# Patient Record
Sex: Male | Born: 1960 | Race: Black or African American | Hispanic: No | Marital: Single | Smoking: Former smoker
Health system: Southern US, Community
[De-identification: ages and names within clinical notes are randomized; demographics above are authoritative.]

## PROBLEM LIST (undated history)

## (undated) DIAGNOSIS — E1165 Type 2 diabetes mellitus with hyperglycemia: Secondary | ICD-10-CM

## (undated) DIAGNOSIS — IMO0002 Reserved for concepts with insufficient information to code with codable children: Secondary | ICD-10-CM

## (undated) DIAGNOSIS — I10 Essential (primary) hypertension: Secondary | ICD-10-CM

## (undated) DIAGNOSIS — I2510 Atherosclerotic heart disease of native coronary artery without angina pectoris: Secondary | ICD-10-CM

## (undated) DIAGNOSIS — E785 Hyperlipidemia, unspecified: Secondary | ICD-10-CM

## (undated) DIAGNOSIS — K219 Gastro-esophageal reflux disease without esophagitis: Secondary | ICD-10-CM

## (undated) DIAGNOSIS — I2699 Other pulmonary embolism without acute cor pulmonale: Secondary | ICD-10-CM

## (undated) HISTORY — PX: INGUINAL HERNIA REPAIR: SUR1180

---

## 2011-06-19 HISTORY — PX: CARDIAC CATHETERIZATION: SHX172

## 2012-06-18 DIAGNOSIS — I2699 Other pulmonary embolism without acute cor pulmonale: Secondary | ICD-10-CM

## 2012-06-18 HISTORY — DX: Other pulmonary embolism without acute cor pulmonale: I26.99

## 2015-11-08 ENCOUNTER — Emergency Department (HOSPITAL_COMMUNITY)

## 2015-11-08 ENCOUNTER — Inpatient Hospital Stay (HOSPITAL_COMMUNITY)
Admission: EM | Admit: 2015-11-08 | Discharge: 2015-11-13 | DRG: 305 | Disposition: A | Discharge status: Home / Self Care | Attending: Internal Medicine | Admitting: Internal Medicine

## 2015-11-08 ENCOUNTER — Encounter (HOSPITAL_COMMUNITY): Payer: Self-pay

## 2015-11-08 DIAGNOSIS — I200 Unstable angina: Secondary | ICD-10-CM

## 2015-11-08 DIAGNOSIS — R51 Headache: Secondary | ICD-10-CM

## 2015-11-08 DIAGNOSIS — R42 Dizziness and giddiness: Secondary | ICD-10-CM | POA: Diagnosis not present

## 2015-11-08 DIAGNOSIS — R079 Chest pain, unspecified: Secondary | ICD-10-CM

## 2015-11-08 DIAGNOSIS — Z23 Encounter for immunization: Secondary | ICD-10-CM

## 2015-11-08 DIAGNOSIS — R519 Headache, unspecified: Secondary | ICD-10-CM | POA: Diagnosis present

## 2015-11-08 DIAGNOSIS — I252 Old myocardial infarction: Secondary | ICD-10-CM | POA: Diagnosis not present

## 2015-11-08 DIAGNOSIS — I160 Hypertensive urgency: Secondary | ICD-10-CM | POA: Diagnosis not present

## 2015-11-08 DIAGNOSIS — Z86711 Personal history of pulmonary embolism: Secondary | ICD-10-CM | POA: Diagnosis present

## 2015-11-08 DIAGNOSIS — I2510 Atherosclerotic heart disease of native coronary artery without angina pectoris: Secondary | ICD-10-CM | POA: Diagnosis present

## 2015-11-08 DIAGNOSIS — Z7982 Long term (current) use of aspirin: Secondary | ICD-10-CM

## 2015-11-08 DIAGNOSIS — E1165 Type 2 diabetes mellitus with hyperglycemia: Secondary | ICD-10-CM | POA: Diagnosis present

## 2015-11-08 DIAGNOSIS — IMO0002 Reserved for concepts with insufficient information to code with codable children: Secondary | ICD-10-CM | POA: Diagnosis present

## 2015-11-08 DIAGNOSIS — Z87891 Personal history of nicotine dependence: Secondary | ICD-10-CM

## 2015-11-08 DIAGNOSIS — I10 Essential (primary) hypertension: Secondary | ICD-10-CM

## 2015-11-08 DIAGNOSIS — I119 Hypertensive heart disease without heart failure: Secondary | ICD-10-CM | POA: Diagnosis present

## 2015-11-08 DIAGNOSIS — Z794 Long term (current) use of insulin: Secondary | ICD-10-CM

## 2015-11-08 DIAGNOSIS — R9431 Abnormal electrocardiogram [ECG] [EKG]: Secondary | ICD-10-CM | POA: Diagnosis present

## 2015-11-08 DIAGNOSIS — Z8249 Family history of ischemic heart disease and other diseases of the circulatory system: Secondary | ICD-10-CM

## 2015-11-08 HISTORY — DX: Type 2 diabetes mellitus with hyperglycemia: E11.65

## 2015-11-08 HISTORY — DX: Other pulmonary embolism without acute cor pulmonale: I26.99

## 2015-11-08 HISTORY — DX: Gastro-esophageal reflux disease without esophagitis: K21.9

## 2015-11-08 HISTORY — DX: Reserved for concepts with insufficient information to code with codable children: IMO0002

## 2015-11-08 LAB — BASIC METABOLIC PANEL
Anion gap: 7 (ref 5–15)
BUN: 6 mg/dL (ref 6–20)
CALCIUM: 9.4 mg/dL (ref 8.9–10.3)
CO2: 27 mmol/L (ref 22–32)
Chloride: 103 mmol/L (ref 101–111)
Creatinine, Ser: 0.97 mg/dL (ref 0.61–1.24)
GFR calc Af Amer: >60 mL/min (ref >60)
GLUCOSE: 129 mg/dL — AB (ref 65–99)
POTASSIUM: 3.6 mmol/L (ref 3.5–5.1)
Sodium: 137 mmol/L (ref 135–145)

## 2015-11-08 LAB — LIPID PANEL
Cholesterol: 182 mg/dL (ref 0–200)
HDL: 41 mg/dL (ref >40)
LDL CALC: 122 mg/dL — AB (ref 0–99)
Total CHOL/HDL Ratio: 4.4 RATIO
Triglycerides: 96 mg/dL (ref <150)
VLDL: 19 mg/dL (ref 0–40)

## 2015-11-08 LAB — URINE MICROSCOPIC-ADD ON

## 2015-11-08 LAB — RAPID URINE DRUG SCREEN, HOSP PERFORMED
AMPHETAMINES: NOT DETECTED
BENZODIAZEPINES: NOT DETECTED
Barbiturates: NOT DETECTED
Cocaine: NOT DETECTED
OPIATES: NOT DETECTED
TETRAHYDROCANNABINOL: NOT DETECTED

## 2015-11-08 LAB — HEPATIC FUNCTION PANEL
ALBUMIN: 3.5 g/dL (ref 3.5–5.0)
ALK PHOS: 66 U/L (ref 38–126)
ALT: 17 U/L (ref 17–63)
AST: 16 U/L (ref 15–41)
BILIRUBIN TOTAL: 0.2 mg/dL — AB (ref 0.3–1.2)
Bilirubin, Direct: <0.1 mg/dL — ABNORMAL LOW (ref 0.1–0.5)
TOTAL PROTEIN: 6.5 g/dL (ref 6.5–8.1)

## 2015-11-08 LAB — CBC
HEMATOCRIT: 39.8 % (ref 39.0–52.0)
Hemoglobin: 13.3 g/dL (ref 13.0–17.0)
MCH: 27.6 pg (ref 26.0–34.0)
MCHC: 33.4 g/dL (ref 30.0–36.0)
MCV: 82.6 fL (ref 78.0–100.0)
Platelets: 163 10*3/uL (ref 150–400)
RBC: 4.82 MIL/uL (ref 4.22–5.81)
RDW: 14 % (ref 11.5–15.5)
WBC: 5.6 10*3/uL (ref 4.0–10.5)

## 2015-11-08 LAB — URINALYSIS, ROUTINE W REFLEX MICROSCOPIC
BILIRUBIN URINE: NEGATIVE
HGB URINE DIPSTICK: NEGATIVE
KETONES UR: 15 mg/dL — AB
LEUKOCYTES UA: NEGATIVE
Nitrite: NEGATIVE
PH: 5.5 (ref 5.0–8.0)
Protein, ur: 30 mg/dL — AB
Specific Gravity, Urine: 1.035 — ABNORMAL HIGH (ref 1.005–1.030)

## 2015-11-08 LAB — I-STAT TROPONIN, ED: Troponin i, poc: 0 ng/mL (ref 0.00–0.08)

## 2015-11-08 LAB — TROPONIN I

## 2015-11-08 MED ORDER — ASPIRIN 81 MG PO CHEW
324.0000 mg | CHEWABLE_TABLET | Freq: Once | ORAL | Status: AC
  Administered 2015-11-08: 324 mg via ORAL
  Filled 2015-11-08: qty 4

## 2015-11-08 MED ORDER — METOPROLOL TARTRATE 25 MG PO TABS
100.0000 mg | ORAL_TABLET | Freq: Two times a day (BID) | ORAL | Status: DC
  Administered 2015-11-08: 100 mg via ORAL
  Filled 2015-11-08: qty 4

## 2015-11-08 MED ORDER — LABETALOL HCL 5 MG/ML IV SOLN
10.0000 mg | INTRAVENOUS | Status: DC | PRN
  Administered 2015-11-08 – 2015-11-09 (×3): 10 mg via INTRAVENOUS
  Filled 2015-11-08 (×3): qty 4

## 2015-11-08 MED ORDER — NITROGLYCERIN IN D5W 200-5 MCG/ML-% IV SOLN
0.0000 ug/min | Freq: Once | INTRAVENOUS | Status: AC
  Administered 2015-11-08: 20 ug/min via INTRAVENOUS
  Filled 2015-11-08: qty 250

## 2015-11-08 MED ORDER — NITROGLYCERIN 0.4 MG SL SUBL
0.4000 mg | SUBLINGUAL_TABLET | SUBLINGUAL | Status: DC | PRN
  Administered 2015-11-09 – 2015-11-10 (×4): 0.4 mg via SUBLINGUAL
  Filled 2015-11-08: qty 1

## 2015-11-08 MED ORDER — CLONIDINE HCL 0.1 MG PO TABS
0.1000 mg | ORAL_TABLET | Freq: Four times a day (QID) | ORAL | Status: DC
  Administered 2015-11-08: 0.1 mg via ORAL
  Filled 2015-11-08: qty 1

## 2015-11-08 MED ORDER — HYDRALAZINE HCL 25 MG PO TABS
50.0000 mg | ORAL_TABLET | Freq: Three times a day (TID) | ORAL | Status: DC
  Administered 2015-11-08: 50 mg via ORAL
  Filled 2015-11-08: qty 2

## 2015-11-08 MED ORDER — ACETAMINOPHEN 325 MG PO TABS
650.0000 mg | ORAL_TABLET | Freq: Once | ORAL | Status: AC
  Administered 2015-11-08: 650 mg via ORAL
  Filled 2015-11-08: qty 2

## 2015-11-08 MED ORDER — OXYCODONE-ACETAMINOPHEN 5-325 MG PO TABS
1.0000 | ORAL_TABLET | Freq: Once | ORAL | Status: AC
Start: 2015-11-08 — End: 2015-11-08
  Administered 2015-11-08: 1 via ORAL
  Filled 2015-11-08: qty 1

## 2015-11-08 NOTE — ED Notes (Signed)
No changes, alert, NAD, calm, interactive, resps e/u, eating sandwich/ evening meal, titrating ntg gtt, [facility]

## 2015-11-08 NOTE — ED Notes (Signed)
Pt alert, NAD, calm, interactive, c/o CP, HA, sob, dizziness, nausea and some hunger, no discomfort noted, no dyspnea noted, BP elevated.

## 2015-11-08 NOTE — ED Notes (Signed)
Pt given meal as per Dr's request

## 2015-11-08 NOTE — H&P (Signed)
Date: 11/08/2015               Patient Name:  Raymond Crane MRN: [id number]  DOB: 01-01-1961 Age / Sex: 55 y.o., male   PCP: No Pcp Per Patient         Medical Service: Internal Medicine Teaching Service         Attending Physician: Dr. Inez CatalinaEmily B Mullen, MD    First Contact: Dr. Valentino NoseNathan Boswell Pager: [phone]  Second Contact: Dr. Hyacinth Meekerasrif Ahmed Pager: [phone]       After Hours (After 5p/  First Contact Pager: [phone]  weekends / holidays): Second Contact Pager: [phone]   Chief Complaint: High blood pressure, chest pain, headache  History of Present Illness: Raymond Crane is a 55 year old male with PMH of HTN, T2DM, MI, and PE who presents with hypertension, chest pain, and headache. [facility] is [facility]. Patient states that his pain began 2-3 days ago. He describes a pounding left sided chest pain with radiation down his left arm and up his neck to the back of his head. His pain has been constant and associated with subjective fever, diaphoresis, lightheadedness, blurry vision, occipital headache, and generalized weakness. He reports numbness and tingling in his left arm. He denies any loss of consciousness, confusion, nausea, vomiting, focal weakness, or back pain. He says this pain occurs when his blood pressures get high. He believes his normal systolic blood pressures range in the 140s - 150s. He takes Amlodipine, Chlorthalidone, Clonidine, Hydralazine, Lisinopril, and Metoprolol. He says he has not missed any doses as they administer and count pills at his institution.  Per nursing note, EMS were called and administered ASA 324 mg and nitro x 5 without relief. His BP was 230/130. He was started on a nitroglycerin infusion in the ED. CXR was without active cardiopulmonary disease. A Head CT was obtained and was negative for acute hemorrhage or infarct. A point of care troponin was negative. Patient reports some improvement of symptoms since nitro drip was started.      Social Hx: Previous smoker 15-20 pack years, former occasional alcohol use, used marijuana in the past, denies cocaine or IVDU  Family Hx: Heart disease in Mother, father, brother. Stroke in mother. DM and HTN in brother and sister.   Meds: Current Facility-Administered Medications  Medication Dose Route Frequency Provider Last Rate Last Dose  . cloNIDine (CATAPRES) tablet 0.1 mg  0.1 mg Oral Q6H Carly J Rivet, MD   0.1 mg at 11/08/15 2309  . hydrALAZINE (APRESOLINE) tablet 50 mg  50 mg Oral Q8H Carly J Rivet, MD      . labetalol (NORMODYNE,TRANDATE) injection 10 mg  10 mg Intravenous Q10 min PRN Su Hoffarly J Rivet, MD   10 mg at 11/08/15 2250  . metoprolol tartrate (LOPRESSOR) tablet 100 mg  100 mg Oral Q12H Su Hoffarly J Rivet, MD   100 mg at 11/08/15 2309  . nitroGLYCERIN (NITROSTAT) SL tablet 0.4 mg  0.4 mg Sublingual Q5 min PRN Su Hoffarly J Rivet, MD       Current Outpatient Prescriptions  Medication Sig Dispense Refill  . amLODipine (NORVASC) 10 MG tablet Take 10 mg by mouth every morning.    Marland Kitchen. aspirin 81 MG chewable tablet Chew 81 mg by mouth every morning.    . chlorthalidone (HYGROTON) 25 MG tablet Take 25 mg by mouth every morning.    . cloNIDine (CATAPRES) 0.1 MG tablet Take 0.1 mg by mouth every 6 (six)  hours. Hold if systolic blood pressure <160 and dystolic blood pressure <90    . gabapentin (NEURONTIN) 300 MG capsule Take 300 mg by mouth 3 (three) times daily.    . hydrALAZINE (APRESOLINE) 50 MG tablet Take 50 mg by mouth every 8 (eight) hours.     . insulin glargine (LANTUS) 100 UNIT/ML injection Inject 35 Units into the skin at bedtime.    . insulin regular (HUMULIN R) 100 units/mL injection Inject 2-12 Units into the skin 3 (three) times daily before meals. Per sliding scale: 200-249 = 2 units; 250-299 = 4 units; 300-349 = 6 units; 350-399 = 8 units; 400-499 = 10 units; 500-549 = 12 units; call provider if >550    . lisinopril (PRINIVIL,ZESTRIL) 40 MG tablet Take 40 mg by mouth every  morning.   0  . metFORMIN (GLUCOPHAGE) 1000 MG tablet Take 1,000 mg by mouth 2 (two) times daily with a meal.    . metoprolol (LOPRESSOR) 100 MG tablet Take 100 mg by mouth every 12 (twelve) hours.    . pantoprazole (PROTONIX) 20 MG tablet Take 20 mg by mouth every morning.    . pravastatin (PRAVACHOL) 40 MG tablet Take 40 mg by mouth at bedtime.    . ranolazine (RANEXA) 500 MG 12 hr tablet Take 1,000 mg by mouth every 12 (twelve) hours.      Allergies: Allergies as of 11/08/2015  . (No Known Allergies)   Past Medical History  Diagnosis Date  . Myocardial infarction (HCC) 2013  . Diabetes mellitus without complication (HCC)   . Hypertension   . Pulmonary embolism (HCC) 2014   Past Surgical History  Procedure Laterality Date  . Cardiac catheterization  2013   Family History  Problem Relation Age of Onset  . Heart disease Mother   . Heart disease Mother   . Stroke Mother   . Diabetes Sister   . Diabetes Brother   . Hypertension Sister   . Hypertension Brother    Social History   Social History  . Marital Status: Single    Spouse Name: N/A  . Number of Children: N/A  . Years of Education: N/A   Occupational History  . Not on file.   Social History Main Topics  . Smoking status: Former Games developer  . Smokeless tobacco: Not on file  . Alcohol Use: No  . Drug Use: No  . Sexual Activity: Not on file   Other Topics Concern  . Not on file   Social History Narrative    Review of Systems: Review of Systems  Constitutional: Positive for fever, malaise/fatigue and diaphoresis. Negative for chills.  Eyes: Positive for blurred vision.  Respiratory: Positive for shortness of breath. Negative for cough, hemoptysis, sputum production and wheezing.   Cardiovascular: Positive for chest pain and palpitations. Negative for orthopnea and leg swelling.  Gastrointestinal: Positive for nausea and diarrhea. Negative for heartburn, vomiting, abdominal pain, constipation, blood in stool  and melena.  Genitourinary: Positive for frequency. Negative for dysuria, urgency, hematuria and flank pain.  Musculoskeletal: Positive for neck pain. Negative for myalgias, back pain, joint pain and falls.  Neurological: Positive for dizziness, tingling, weakness and headaches. Negative for tremors, speech change, focal weakness and loss of consciousness.  Psychiatric/Behavioral: Negative for substance abuse.     Physical Exam: Blood pressure 137/79, pulse 72, temperature 97.8 F (36.6 C), resp. rate 12, height  (1.854 m), weight 255 lb (115.667 kg), SpO2 96 %. Physical Exam  Constitutional: He is oriented to  person, place, and time. He appears well-developed and well-nourished. No distress.  HENT:  Head: Normocephalic and atraumatic.  Mouth/Throat: Oropharynx is clear and moist.  Eyes: EOM are normal. Pupils are equal, round, and reactive to light.  Neck: Normal range of motion. Neck supple. Carotid bruit is not present.  Cardiovascular: Normal rate, regular rhythm and intact distal pulses.   Soft early systolic murmur RUS border  Pulmonary/Chest: Effort normal and breath sounds normal. No respiratory distress. He has no wheezes. He has no rales. He exhibits no tenderness.  Abdominal: Soft. Bowel sounds are normal. He exhibits no distension. There is no tenderness. There is no guarding.  Musculoskeletal: Normal range of motion. He exhibits no edema or tenderness.  Neurological: He is alert and oriented to person, place, and time. He has normal strength. He displays no tremor. Coordination normal.  Unequal sensation to touch in the V1 distribution. Strength 5/5 all extremities, coordination intact, finger grip and rapid finger tapping intact, finger-to-nose intact.   Skin: Skin is warm. He is not diaphoretic.  Psychiatric: He has a normal mood and affect.     Lab results: Basic Metabolic Panel:  Recent Labs  14/78/29 1754  NA 137  K 3.6  CL 103  CO2 27  GLUCOSE 129*    BUN 6  CREATININE 0.97  CALCIUM 9.4   Liver Function Tests: No results for input(s): AST, ALT, ALKPHOS, BILITOT, PROT, ALBUMIN in the last 72 hours. No results for input(s): LIPASE, AMYLASE in the last 72 hours. No results for input(s): AMMONIA in the last 72 hours. CBC:  Recent Labs  11/08/15 1754  WBC 5.6  HGB 13.3  HCT 39.8  MCV 82.6  PLT 163   Cardiac Enzymes: No results for input(s): CKTOTAL, CKMB, CKMBINDEX, TROPONINI in the last 72 hours. BNP: No results for input(s): PROBNP in the last 72 hours. D-Dimer: No results for input(s): DDIMER in the last 72 hours. CBG: No results for input(s): GLUCAP in the last 72 hours. Hemoglobin A1C: No results for input(s): HGBA1C in the last 72 hours. Fasting Lipid Panel: No results for input(s): CHOL, HDL, LDLCALC, TRIG, CHOLHDL, LDLDIRECT in the last 72 hours. Thyroid Function Tests: No results for input(s): TSH, T4TOTAL, FREET4, T3FREE, THYROIDAB in the last 72 hours. Anemia Panel: No results for input(s): VITAMINB12, FOLATE, FERRITIN, TIBC, IRON, RETICCTPCT in the last 72 hours. Coagulation: No results for input(s): LABPROT, INR in the last 72 hours. Urine Drug Screen: Drugs of Abuse  No results found for: LABOPIA, COCAINSCRNUR, LABBENZ, AMPHETMU, THCU, LABBARB  Alcohol Level: No results for input(s): ETH in the last 72 hours. Urinalysis: No results for input(s): COLORURINE, LABSPEC, PHURINE, GLUCOSEU, HGBUR, BILIRUBINUR, KETONESUR, PROTEINUR, UROBILINOGEN, NITRITE, LEUKOCYTESUR in the last 72 hours.  Invalid input(s): APPERANCEUR   Imaging results:  Dg Chest 2 View  11/08/2015  CLINICAL DATA:  Chest pain EXAM: CHEST  2 VIEW COMPARISON:  None. FINDINGS: Normal heart size. Normal mediastinal contour. No pneumothorax. No pleural effusion. Lungs appear clear, with no acute consolidative airspace disease and no pulmonary edema. IMPRESSION: No active cardiopulmonary disease. Electronically Signed   By: Delbert Phenix M.D.   On:  11/08/2015 18:57   Ct Head Wo Contrast  11/08/2015  CLINICAL DATA:  Headache, hypertension, dizziness. Symptom onset this afternoon. EXAM: CT HEAD WITHOUT CONTRAST TECHNIQUE: Contiguous axial images were obtained from the base of the skull through the vertex without intravenous contrast. COMPARISON:  None. FINDINGS: Brain: No evidence of acute infarction, hemorrhage, extra-axial collection, ventriculomegaly, or  mass effect. Vascular: No hyperdense vessel or unexpected calcification. Skull: Negative for fracture or focal lesion. Sinuses/Orbits: No acute findings. Other: Suspect small subgaleal hematoma about the left posterior parietal scalp, age-indeterminate. IMPRESSION: No acute intracranial abnormality. Electronically Signed   By: Rubye Oaks M.D.   On: 11/08/2015 21:17    Other results: EKG: sinus rhythm, TWI lead II, nonspecific changes II, aVL  Assessment & Plan by Problem: Principal Problem:   Hypertensive urgency Active Problems:   Type 2 diabetes mellitus (HCC)   History of pulmonary embolism   History of MI (myocardial infarction)   Chest pain   Hypertensive Urgency: Patient hypertensive to 230/130 with symptoms of left sided chest pain, posterior neck pain, occipital headache, and numbness/tingling down left arm. CT head was negative for hemorrhage. Patient reports similar symptoms in the past which indicate that his blood pressures are too high. He reports adherence to his prescribed blood pressure medications. He is currently on 6 antihypertensives. Unknown whether he has been worked up for secondary hypertension. We will continue home medications and give prn Labetalol for goal SBP of 160 while monitoring for signs of end-organ damage. Istat troponin is negative and CBC/BMP are within normal limits. Will check renal U/S to evaluate for renal artery stenosis. His chest pain is likely related to his HTN, but he reports a history of MI and probable CAD, so we will trend troponins  for ACS rule out. EKG shows t-wave inversions in lead III w/ non-specific changes in II and aVL. -Labetalol 10 mg IV q55min prn high blood pressures, goal SBP 160 -Amlodipine 10 mg qd -Clonidine 0.1 mg q6h (hold if SBP <160, DBP <90) -Chlorthalidone 25 mg qd -Hydralazine 50 mg q8h -Lisinopril 40 mg qd -Metoprolol 100 mg q12h -f/u Renal U/S -trend troponins -EKG -Check TSH, HIV, Lipid panel, UDS -prn nitro -Zofran prn   T2DM: Prescribed Metformin 1000 mg, Lantus 35u qhs, and Humulin sliding scale. Serum glucose 129. -Check Hgb A1C -SSI   Hx of MI: Reports MI 12-14 years ago. Had a cath, but unsure about stent. No records on file.  -Ranexa 1000 mg q12h -Pravastatin 20 mg qd -ASA 81 mg    Diet: Heart  DVT ppx: Lovenox  Code: FULL   Dispo: Disposition is deferred at this time, awaiting improvement of current medical problems. Anticipated discharge in approximately 1-2 day(s).   The patient does not have a current PCP (No Pcp Per Patient) and does not need an [facility] hospital follow-up appointment after discharge.  The patient does have transportation limitations that hinder transportation to clinic appointments.  Signed: Darreld Mclean, MD 11/08/2015, 11:18 PM

## 2015-11-08 NOTE — ED Provider Notes (Signed)
CSN: [id number]     Arrival date & time 11/08/15  1703 History   First MD Initiated Contact with Patient 11/08/15 1715     Chief Complaint  Patient presents with  . Chest Pain  . Hypertension   HPI  [name] is a 55 year old male with past medical history of hypertension, diabetes, MI and PE presenting with hypertension, headache and chest pain. Patient states that he can tell when his blood pressure is high because he gets a severe headache. He states that 4 days ago he had acute onset of occipital headache. He states it feels like his head is "about to bust". The headache has been gradually worsening over the past 4 days. He endorses associated dizziness described as feeling like he might pass out. States that the sensation is constant and is not affected by position changes. He also complains of left-sided numbness and weakness. He states that he is numb over the entire left side of his body including the left side of his neck, arm, left chest and left lower extremity. He reports onset of left-sided chest pain approximately 2 days ago. He describes this as a throbbing sensation. The chest pain does not radiate. States chest pain worsens with exertion. Denies associated shortness of breath. He received nitroglycerin from his correctional facility and reports this slightly improved his chest pain. He reports compliance with his hypertensive medications. Endorses associated nausea without vomiting.He reports a history of cardiac catheterization but denies stent placement. He is currently incarcerated and has no PCP.  Past Medical History  Diagnosis Date  . Myocardial infarction (HCC) 2013  . Diabetes mellitus without complication (HCC)   . Hypertension   . Pulmonary embolism (HCC) 2014   Past Surgical History  Procedure Laterality Date  . Cardiac catheterization  2013   History reviewed. No pertinent family history. Social History  Substance Use Topics  . Smoking status: Former Games developer  .  Smokeless tobacco: None  . Alcohol Use: No    Review of Systems  All other systems reviewed and are negative.     Allergies  Review of patient's allergies indicates no known allergies.  Home Medications   Prior to Admission medications   Medication Sig Start Date End Date Taking? Authorizing Provider  amLODipine (NORVASC) 10 MG tablet Take 10 mg by mouth every morning.   Yes Historical Provider, MD  aspirin 81 MG chewable tablet Chew 81 mg by mouth every morning.   Yes Historical Provider, MD  chlorthalidone (HYGROTON) 25 MG tablet Take 25 mg by mouth every morning.   Yes Historical Provider, MD  cloNIDine (CATAPRES) 0.1 MG tablet Take 0.1 mg by mouth every 6 (six) hours. Hold if systolic blood pressure <160 and dystolic blood pressure <90   Yes Historical Provider, MD  gabapentin (NEURONTIN) 300 MG capsule Take 300 mg by mouth 3 (three) times daily.   Yes Historical Provider, MD  hydrALAZINE (APRESOLINE) 50 MG tablet Take 50 mg by mouth 3 (three) times daily.   Yes Historical Provider, MD  insulin glargine (LANTUS) 100 UNIT/ML injection Inject 35 Units into the skin at bedtime.   Yes Historical Provider, MD  insulin regular (HUMULIN R) 100 units/mL injection Inject 2-12 Units into the skin 3 (three) times daily before meals. Per sliding scale: 200-249 = 2 units; 250-299 = 4 units; 300-349 = 6 units; 350-399 = 8 units; 400-499 = 10 units; 500-249 = 12 units   Yes Historical Provider, MD  lisinopril (PRINIVIL,ZESTRIL) 40 MG tablet Take 40  mg by mouth every morning.  09/23/15  Yes Historical Provider, MD  metFORMIN (GLUCOPHAGE) 1000 MG tablet Take 1,000 mg by mouth 2 (two) times daily with a meal.   Yes Historical Provider, MD  metoprolol (LOPRESSOR) 100 MG tablet Take 100 mg by mouth every 12 (twelve) hours.   Yes Historical Provider, MD  pantoprazole (PROTONIX) 20 MG tablet Take 20 mg by mouth every morning.   Yes Historical Provider, MD  pravastatin (PRAVACHOL) 40 MG tablet Take 40 mg by  mouth at bedtime.   Yes Historical Provider, MD  ranolazine (RANEXA) 500 MG 12 hr tablet Take 1,000 mg by mouth every 12 (twelve) hours.   Yes Historical Provider, MD   BP 173/126 mmHg  Pulse 63  Temp(Src) 97.8 F (36.6 C)  Resp 14  Ht  (1.854 m)  Wt 115.667 kg  BMI 33.65 kg/m2  SpO2 95% Physical Exam  Constitutional: He is oriented to person, place, and time. He appears well-developed and well-nourished. No distress.  HENT:  Head: Normocephalic and atraumatic.  Mouth/Throat: Oropharynx is clear and moist. No oropharyngeal exudate.  Eyes: Conjunctivae and EOM are normal. Pupils are equal, round, and reactive to light. Right eye exhibits no discharge. Left eye exhibits no discharge.  Neck: Normal range of motion. Neck supple. No rigidity.  Cardiovascular: Normal rate, regular rhythm and normal heart sounds.   Pulmonary/Chest: Effort normal and breath sounds normal. No respiratory distress.  Abdominal: Soft. There is no tenderness. There is no rebound and no guarding.  Musculoskeletal: Normal range of motion.  Neurological: He is alert and oriented to person, place, and time. No cranial nerve deficit. He exhibits normal muscle tone. Coordination normal.  Cranial nerves 3-12 tested and intact. 5/5 strength of all major muscle groups. Sensation to light touch intact throughout. Finger to nose and heel to shin coordinated.  Skin: Skin is warm and dry.  Psychiatric: He has a normal mood and affect. His behavior is normal.  Nursing note and vitals reviewed.   ED Course  Procedures (including critical care time) Labs Review Labs Reviewed  BASIC METABOLIC PANEL - Abnormal; Notable for the following:    Glucose, Bld 129 (*)    All other components within normal limits  CBC  I-STAT TROPOININ, ED    Imaging Review Dg Chest 2 View  11/08/2015  CLINICAL DATA:  Chest pain EXAM: CHEST  2 VIEW COMPARISON:  None. FINDINGS: Normal heart size. Normal mediastinal contour. No pneumothorax.  No pleural effusion. Lungs appear clear, with no acute consolidative airspace disease and no pulmonary edema. IMPRESSION: No active cardiopulmonary disease. Electronically Signed   By: Delbert Phenix M.D.   On: 11/08/2015 18:57   I have personally reviewed and evaluated these images and lab results as part of my medical decision-making.   EKG Interpretation   Date/Time:  Tuesday Nov 08 2015 17:08:24 EDT Ventricular Rate:  82 PR Interval:  163 QRS Duration: 101 QT Interval:  375 QTC Calculation: 438 R Axis:   52 Text Interpretation:  Sinus rhythm Probable left atrial enlargement  Borderline T abnormalities, inferior leads No old tracing to compare  Confirmed by Ethelda Chick  MD, SAM [phone]) on 11/08/2015 5:16:33 PM      MDM   Final diagnoses:  Essential hypertension  Headache, unspecified headache type  Chest pain, unspecified chest pain type   55 year old male presenting with hypertension, headache, and chest pain 4 days. Patient is currently incarcerated. Reports compliance with antihypertensives. Heart regular rate and rhythm. Lungs clear  to auscultation bilaterally. Nonfocal neuro exam. Blood pressure remains elevated in 180s/120s. Started on IV nitro drip. Blood work unremarkable. Troponin negative with nonischemic EKG. Chest x-ray negative for acute disease. Given headache and neuro complaints, CT of the head without contrast ordered. Patient care is signed out to Dr. Rennis ChrisJacobowitz at shift change. Anticipate admission for unstable angina and control of blood pressure.    Alveta HeimlichStevi Thurman Sarver, PA-C 11/08/15 2015  Doug SouSam Jacubowitz, MD 11/08/15 2155

## 2015-11-08 NOTE — ED Provider Notes (Signed)
6 PM Complains of anterior chest pain and occipital headache onset approximately 4 days ago. He states his pain is improved after treating himself with 5 sublingual nitroglycerin. Pain feels like "heart pain he's had in the past". Also feels as if his blood pressure is up. He gets similar headaches when his blood pressure goes up. On exam patient is alert Glasgow Coma Score 15 appears in no distress HEENT exam no facial asymmetry neck supple no JVD or bruit lungs clear auscultation heart regular rate and rhythm no murmurs  abdomen nondistended nontender. Extremity showed edema. Neurologic Glasgow Coma Score 15 cranial nerves II through XII was intact moves all extremities well motor strength 5 over 5 overall. At 9:30 PM pain almost gone after treatment with aspirin and intravenous nitroglycerin question drip. Headache is minimal and anterior chest pain is minimal as well. Heart score equals 6 given history, risk factors age and EKG Internal medicine resident physician consulted to arrange for inpatient stay. There is no evidence of undue organ damage however patient is symptomatic with elevated blood pressure Results for orders placed or performed during the hospital encounter of 11/08/15  Basic metabolic panel  Result Value Ref Range   Sodium 137 135 - 145 mmol/L   Potassium 3.6 3.5 - 5.1 mmol/L   Chloride 103 101 - 111 mmol/L   CO2 27 22 - 32 mmol/L   Glucose, Bld 129 (H) 65 - 99 mg/dL   BUN 6 6 - 20 mg/dL   Creatinine, Ser 1.610.97 0.61 - 1.24 mg/dL   Calcium 9.4 8.9 - 09.610.3 mg/dL   GFR calc non Af Amer >60 >60 mL/min   GFR calc Af Amer >60 >60 mL/min   Anion gap 7 5 - 15  CBC  Result Value Ref Range   WBC 5.6 4.0 - 10.5 K/uL   RBC 4.82 4.22 - 5.81 MIL/uL   Hemoglobin 13.3 13.0 - 17.0 g/dL   HCT 04.539.8 40.939.0 - 81.152.0 %   MCV 82.6 78.0 - 100.0 fL   MCH 27.6 26.0 - 34.0 pg   MCHC 33.4 30.0 - 36.0 g/dL   RDW 91.414.0 78.211.5 - 95.615.5 %   Platelets 163 150 - 400 K/uL  I-stat troponin, ED  Result Value Ref  Range   Troponin i, poc 0.00 0.00 - 0.08 ng/mL   Comment 3           Dg Chest 2 View  11/08/2015  CLINICAL DATA:  Chest pain EXAM: CHEST  2 VIEW COMPARISON:  None. FINDINGS: Normal heart size. Normal mediastinal contour. No pneumothorax. No pleural effusion. Lungs appear clear, with no acute consolidative airspace disease and no pulmonary edema. IMPRESSION: No active cardiopulmonary disease. Electronically Signed   By: Delbert PhenixJason A Poff M.D.   On: 11/08/2015 18:57   Ct Head Wo Contrast  11/08/2015  CLINICAL DATA:  Headache, hypertension, dizziness. Symptom onset this afternoon. EXAM: CT HEAD WITHOUT CONTRAST TECHNIQUE: Contiguous axial images were obtained from the base of the skull through the vertex without intravenous contrast. COMPARISON:  None. FINDINGS: Brain: No evidence of acute infarction, hemorrhage, extra-axial collection, ventriculomegaly, or mass effect. Vascular: No hyperdense vessel or unexpected calcification. Skull: Negative for fracture or focal lesion. Sinuses/Orbits: No acute findings. Other: Suspect small subgaleal hematoma about the left posterior parietal scalp, age-indeterminate. IMPRESSION: No acute intracranial abnormality. Electronically Signed   By: Rubye OaksMelanie  Ehinger M.D.   On: 11/08/2015 21:17    Diagnosis #1 unstable angina #2 headache #3 hypertension CRITICAL CARE Performed by: Doug SouJACUBOWITZ,Marlaine Arey Total  critical care time: 35 minutes Critical care time was exclusive of separately billable procedures and treating other patients. Critical care was necessary to treat or prevent imminent or life-threatening deterioration. Critical care was time spent personally by me on the following activities: development of treatment plan with patient and/or surrogate as well as nursing, discussions with consultants, evaluation of patient's response to treatment, examination of patient, obtaining history from patient or surrogate, ordering and performing treatments and interventions, ordering  and review of laboratory studies, ordering and review of radiographic studies, pulse oximetry and re-evaluation of patient's condition.  Doug Sou, MD 11/08/15 2154

## 2015-11-08 NOTE — ED Notes (Signed)
Per EMS, pt here [facility]. Pt has recent hx of htn and chest pain. Pt complains today of CP/HTN. Non radiating. Pt describes CP as pounding. Pt admits to dizziness, denies nausea/v, sob. Pt received 324asa and nitro x 5 at facility without relief. Pt has 20ga in left hand. Pt alert and oriented x 4. EKG unremarkable. VS 230/130, HR 74, SPO2 96% on 2L(pt does not wear o2 at home). CBG 185, RR 16.

## 2015-11-08 NOTE — ED Notes (Signed)
No change, pt to CT.

## 2015-11-08 NOTE — ED Notes (Signed)
MD at bedside. 

## 2015-11-09 ENCOUNTER — Encounter (HOSPITAL_COMMUNITY): Payer: Self-pay | Admitting: General Practice

## 2015-11-09 DIAGNOSIS — R9431 Abnormal electrocardiogram [ECG] [EKG]: Secondary | ICD-10-CM | POA: Diagnosis not present

## 2015-11-09 DIAGNOSIS — R51 Headache: Secondary | ICD-10-CM

## 2015-11-09 DIAGNOSIS — I2510 Atherosclerotic heart disease of native coronary artery without angina pectoris: Secondary | ICD-10-CM | POA: Diagnosis present

## 2015-11-09 DIAGNOSIS — I119 Hypertensive heart disease without heart failure: Secondary | ICD-10-CM | POA: Diagnosis present

## 2015-11-09 DIAGNOSIS — Z794 Long term (current) use of insulin: Secondary | ICD-10-CM | POA: Diagnosis not present

## 2015-11-09 DIAGNOSIS — I25119 Atherosclerotic heart disease of native coronary artery with unspecified angina pectoris: Secondary | ICD-10-CM | POA: Diagnosis not present

## 2015-11-09 DIAGNOSIS — I160 Hypertensive urgency: Secondary | ICD-10-CM | POA: Diagnosis not present

## 2015-11-09 DIAGNOSIS — E1165 Type 2 diabetes mellitus with hyperglycemia: Secondary | ICD-10-CM | POA: Diagnosis not present

## 2015-11-09 DIAGNOSIS — I252 Old myocardial infarction: Secondary | ICD-10-CM | POA: Diagnosis not present

## 2015-11-09 DIAGNOSIS — R519 Headache, unspecified: Secondary | ICD-10-CM | POA: Diagnosis present

## 2015-11-09 DIAGNOSIS — R079 Chest pain, unspecified: Secondary | ICD-10-CM | POA: Diagnosis not present

## 2015-11-09 DIAGNOSIS — I10 Essential (primary) hypertension: Secondary | ICD-10-CM | POA: Diagnosis not present

## 2015-11-09 LAB — BASIC METABOLIC PANEL
ANION GAP: 9 (ref 5–15)
BUN: 7 mg/dL (ref 6–20)
CALCIUM: 8.9 mg/dL (ref 8.9–10.3)
CO2: 25 mmol/L (ref 22–32)
CREATININE: 1.21 mg/dL (ref 0.61–1.24)
Chloride: 99 mmol/L — ABNORMAL LOW (ref 101–111)
Glucose, Bld: 430 mg/dL — ABNORMAL HIGH (ref 65–99)
Potassium: 4 mmol/L (ref 3.5–5.1)
Sodium: 133 mmol/L — ABNORMAL LOW (ref 135–145)

## 2015-11-09 LAB — CBG MONITORING, ED: GLUCOSE-CAPILLARY: 448 mg/dL — AB (ref 65–99)

## 2015-11-09 LAB — TSH: TSH: 0.754 u[IU]/mL (ref 0.350–4.500)

## 2015-11-09 LAB — TROPONIN I: Troponin I: <0.03 ng/mL (ref <0.031)

## 2015-11-09 LAB — GLUCOSE, CAPILLARY
GLUCOSE-CAPILLARY: 423 mg/dL — AB (ref 65–99)
Glucose-Capillary: 474 mg/dL — ABNORMAL HIGH (ref 65–99)

## 2015-11-09 LAB — D-DIMER, QUANTITATIVE: D-Dimer, Quant: 0.32 ug/mL-FEU (ref 0.00–0.50)

## 2015-11-09 LAB — HIV ANTIBODY (ROUTINE TESTING W REFLEX): HIV SCREEN 4TH GENERATION: NONREACTIVE

## 2015-11-09 MED ORDER — KETOROLAC TROMETHAMINE 15 MG/ML IJ SOLN
15.0000 mg | Freq: Once | INTRAMUSCULAR | Status: AC
  Administered 2015-11-09: 15 mg via INTRAVENOUS
  Filled 2015-11-09: qty 1

## 2015-11-09 MED ORDER — METOPROLOL TARTRATE 25 MG PO TABS
25.0000 mg | ORAL_TABLET | Freq: Two times a day (BID) | ORAL | Status: DC
Start: 2015-11-09 — End: 2015-11-09

## 2015-11-09 MED ORDER — PRAVASTATIN SODIUM 40 MG PO TABS
40.0000 mg | ORAL_TABLET | Freq: Every day | ORAL | Status: DC
  Administered 2015-11-09 – 2015-11-12 (×4): 40 mg via ORAL
  Filled 2015-11-09 (×4): qty 1

## 2015-11-09 MED ORDER — PANTOPRAZOLE SODIUM 20 MG PO TBEC
20.0000 mg | DELAYED_RELEASE_TABLET | Freq: Every morning | ORAL | Status: DC
  Administered 2015-11-09 – 2015-11-13 (×5): 20 mg via ORAL
  Filled 2015-11-09 (×5): qty 1

## 2015-11-09 MED ORDER — CLONIDINE HCL 0.1 MG PO TABS
0.1000 mg | ORAL_TABLET | Freq: Every day | ORAL | Status: DC
  Administered 2015-11-09 – 2015-11-12 (×4): 0.1 mg via ORAL
  Filled 2015-11-09 (×4): qty 1

## 2015-11-09 MED ORDER — INSULIN ASPART 100 UNIT/ML ~~LOC~~ SOLN
0.0000 [IU] | Freq: Every day | SUBCUTANEOUS | Status: DC
  Administered 2015-11-09: 5 [IU] via SUBCUTANEOUS
  Administered 2015-11-10: 3 [IU] via SUBCUTANEOUS
  Administered 2015-11-11: 4 [IU] via SUBCUTANEOUS

## 2015-11-09 MED ORDER — INSULIN ASPART 100 UNIT/ML ~~LOC~~ SOLN
0.0000 [IU] | Freq: Three times a day (TID) | SUBCUTANEOUS | Status: DC
  Administered 2015-11-09: 15 [IU] via SUBCUTANEOUS
  Administered 2015-11-09: 8 [IU] via SUBCUTANEOUS
  Administered 2015-11-09: 15 [IU] via SUBCUTANEOUS
  Administered 2015-11-10: 3 [IU] via SUBCUTANEOUS
  Administered 2015-11-10: 11 [IU] via SUBCUTANEOUS
  Administered 2015-11-10 – 2015-11-11 (×2): 3 [IU] via SUBCUTANEOUS
  Administered 2015-11-11: 5 [IU] via SUBCUTANEOUS
  Administered 2015-11-12: 3 [IU] via SUBCUTANEOUS
  Administered 2015-11-12: 5 [IU] via SUBCUTANEOUS
  Administered 2015-11-12: 8 [IU] via SUBCUTANEOUS
  Administered 2015-11-13: 2 [IU] via SUBCUTANEOUS
  Administered 2015-11-13: 3 [IU] via SUBCUTANEOUS

## 2015-11-09 MED ORDER — RANOLAZINE ER 500 MG PO TB12
1000.0000 mg | ORAL_TABLET | Freq: Two times a day (BID) | ORAL | Status: DC
  Administered 2015-11-09 – 2015-11-11 (×5): 1000 mg via ORAL
  Filled 2015-11-09 (×5): qty 2

## 2015-11-09 MED ORDER — NITROGLYCERIN IN D5W 200-5 MCG/ML-% IV SOLN
INTRAVENOUS | Status: AC
  Filled 2015-11-09: qty 250

## 2015-11-09 MED ORDER — ONDANSETRON HCL 4 MG PO TABS: 4.0000 mg | ORAL_TABLET | Freq: Four times a day (QID) | ORAL | Status: DC | PRN

## 2015-11-09 MED ORDER — SODIUM CHLORIDE 0.9% FLUSH
3.0000 mL | Freq: Two times a day (BID) | INTRAVENOUS | Status: DC
  Administered 2015-11-09 – 2015-11-13 (×6): 3 mL via INTRAVENOUS

## 2015-11-09 MED ORDER — ENOXAPARIN SODIUM 40 MG/0.4ML ~~LOC~~ SOLN
40.0000 mg | SUBCUTANEOUS | Status: DC
  Administered 2015-11-09 – 2015-11-12 (×4): 40 mg via SUBCUTANEOUS
  Filled 2015-11-09 (×4): qty 0.4

## 2015-11-09 MED ORDER — INSULIN GLARGINE 100 UNIT/ML ~~LOC~~ SOLN
30.0000 [IU] | Freq: Every day | SUBCUTANEOUS | Status: DC
Start: 2015-11-09 — End: 2015-11-12
  Administered 2015-11-09 – 2015-11-11 (×3): 30 [IU] via SUBCUTANEOUS
  Filled 2015-11-09 (×6): qty 0.3

## 2015-11-09 MED ORDER — MORPHINE SULFATE (PF) 2 MG/ML IV SOLN
1.0000 mg | INTRAVENOUS | Status: DC | PRN
  Administered 2015-11-10 – 2015-11-12 (×7): 1 mg via INTRAVENOUS
  Filled 2015-11-09 (×8): qty 1

## 2015-11-09 MED ORDER — PROCHLORPERAZINE EDISYLATE 5 MG/ML IJ SOLN
10.0000 mg | Freq: Once | INTRAMUSCULAR | Status: AC
  Administered 2015-11-09: 10 mg via INTRAVENOUS
  Filled 2015-11-09: qty 2

## 2015-11-09 MED ORDER — ONDANSETRON HCL 4 MG/2ML IJ SOLN: 4.0000 mg | Freq: Four times a day (QID) | INTRAMUSCULAR | Status: DC | PRN

## 2015-11-09 MED ORDER — GABAPENTIN 300 MG PO CAPS
300.0000 mg | ORAL_CAPSULE | Freq: Three times a day (TID) | ORAL | Status: DC
  Administered 2015-11-09 – 2015-11-13 (×13): 300 mg via ORAL
  Filled 2015-11-09 (×13): qty 1

## 2015-11-09 MED ORDER — ASPIRIN 81 MG PO CHEW
81.0000 mg | CHEWABLE_TABLET | Freq: Every morning | ORAL | Status: DC
  Administered 2015-11-09 – 2015-11-13 (×5): 81 mg via ORAL
  Filled 2015-11-09 (×5): qty 1

## 2015-11-09 MED ORDER — HYDRALAZINE HCL 50 MG PO TABS
50.0000 mg | ORAL_TABLET | Freq: Three times a day (TID) | ORAL | Status: DC
  Administered 2015-11-10 – 2015-11-13 (×10): 50 mg via ORAL
  Filled 2015-11-09 (×10): qty 1

## 2015-11-09 MED ORDER — AMLODIPINE BESYLATE 10 MG PO TABS
10.0000 mg | ORAL_TABLET | Freq: Every morning | ORAL | Status: DC
  Administered 2015-11-10 – 2015-11-13 (×4): 10 mg via ORAL
  Filled 2015-11-09 (×4): qty 1

## 2015-11-09 MED ORDER — PNEUMOCOCCAL VAC POLYVALENT 25 MCG/0.5ML IJ INJ
0.5000 mL | INJECTION | INTRAMUSCULAR | Status: AC
  Administered 2015-11-10: 0.5 mL via INTRAMUSCULAR
  Filled 2015-11-09: qty 0.5

## 2015-11-09 MED ORDER — CLONIDINE HCL 0.1 MG PO TABS: 0.1000 mg | ORAL_TABLET | Freq: Every day | ORAL | Status: DC

## 2015-11-09 NOTE — Progress Notes (Signed)
Received patient from ED with complaints of 8/10 left chest pain.  Patient on IV NTG drip at 50 mcg to assist with CP and BP.  EKG obtained.  CBG 474.  Dr. Karma GreaserBoswell paged and notified.  No orders received.  [facility].  Colman Caterarpley, Quintyn Dombek Danielle

## 2015-11-09 NOTE — ED Notes (Signed)
Pt transported to [facility] via stretcher with RN.

## 2015-11-09 NOTE — Progress Notes (Signed)
Subjective:  Mr. Colette RibasByrd was seen and examined this AM.  Has recurrent chest pain now.  Describes 10/10 left sided pressure radiating down left arm and leg.   Also, with headache and blurry vision.   No tearing back pain.  He says he has had headaches since falling a few years ago and he has been told he has a pinched nerve.  Also notes headache and chest pain occur when BPs are high.  He reports hx of cath (3 years ago and 1 year ago) but he does not know if he had stents placed.  Objective: Vital signs in last 24 hours: Filed Vitals:   11/09/15 0730 11/09/15 0807 11/09/15 0840 11/09/15 0850  BP: 141/105 135/98 138/112 129/94  Pulse: 101 93 85 94  Temp:   98.9 F (37.2 C)   TempSrc:   Oral   Resp: 16 16 22 21   Height:      Weight:      SpO2: 94% 95% 99% 95%   Weight change:  No intake or output data in the 24 hours ending 11/09/15 1013 General: sitting up in bed in NAD, cooperative with exam HEENT: [place]/AT, neck supple Cardiac: RRR, + 2/6 systolic murmur, no rubs or gallops; no carotid bruits, peripheral pulses 2+ B/L Pulm: clear to auscultation bilaterally, moving normal volumes of air Abd: soft, nontender, nondistended, BS present Ext: warm and well perfused, no pedal edema Neuro: alert and oriented X3, CN 2-12 except unequal facial sensation (left feels lighter than right) noted at admission, intact MAE x 4, 5/5 MMS upper and lower extremities, reflexes normal, responding appropriately; coordination normal  Lab Results: Basic Metabolic Panel:  Recent Labs Lab 11/08/15 1754 11/09/15 0800  NA 137 133*  K 3.6 4.0  CL 103 99*  CO2 27 25  GLUCOSE 129* 430*  BUN 6 7  CREATININE 0.97 1.21  CALCIUM 9.4 8.9   Cardiac Enzymes:  Recent Labs Lab 11/08/15 2238 11/09/15 0409  TROPONINI <0.03 <0.03   CBG:  Recent Labs Lab 11/09/15 0743 11/09/15 0852  GLUCAP 448* 474*   Hemoglobin A1C:  pending  Medications: I have reviewed the patient's current  medications. Scheduled Meds: . aspirin  81 mg Oral q morning - 10a  . cloNIDine  0.1 mg Oral Daily  . enoxaparin (LOVENOX) injection  40 mg Subcutaneous Q24H  . gabapentin  300 mg Oral TID  . insulin aspart  0-15 Units Subcutaneous TID WC  . insulin aspart  0-5 Units Subcutaneous QHS  . insulin glargine  30 Units Subcutaneous QHS  . ketorolac  15 mg Intravenous Once  . nitroGLYCERIN      . pantoprazole  20 mg Oral q morning - 10a  . pravastatin  40 mg Oral QHS  . prochlorperazine  10 mg Intravenous Once  . ranolazine  1,000 mg Oral Q12H  . sodium chloride flush  3 mL Intravenous Q12H   Continuous Infusions: NTG gtt PRN Meds:.nitroGLYCERIN, ondansetron **OR** ondansetron (ZOFRAN) IV Assessment/Plan:  Hypertensive urgency:  No sign of end organ damage.  BPs controlled over night, now lower than goal.  no sign of stroke on exam and no tearing back pain and pulses intact.  CT head and CXR unremarkable overnight.  Renal function good, no hematuria.  Headache but neuro exam reassuring, possibly worsened by NTG.  Unclear precipitant.  He is an inmate and should be getting all prescribed meds.  UDS negative.  - hold AM anti-HTN meds and wean off NTG gtt given low-normal  BP and headache, d/c prns - keep BP goal ~ 140-150 SBP - continue clonidine tonight to avoid rebound; add back other anti-HTN meds as BP allows - in addition to titrating down NTG, will try migraine cocktail for headache  Chest pain w/ CAD hx:  Possibly due to above.  Trop have been negative x 3.  Repeat EKG with abnormal inferior Twaves unchanged from admission, no ST elevation.  Hx of PE but no unilateral leg edema, tachy or CP - stat Trop now given recurrence of chest pain - continue ASA, statin, Ranexa, prn SL NTG - wean off NTG gtt given low BP and headache - consult cardiology given CP recurrence; consider need for cath  Type 2 diabetes mellitus:   Unknown A1c.  On metformin, Lantus and humulin as outpatient.  CBG  elevated this AM without usual long acting insulin overnight. - Lantus 30 units qHS, titrate up as needed - continue SSI-S - hold metformin while inpatient, may need contrast procedure  Diet:  Carb mod VTE ppx:  [place] Lovenox, SCDs Code status:  Full  Dispo: Disposition is deferred at this time, awaiting improvement of current medical problems.  Anticipated discharge in approximately 1-2 day(s).   The patient does not have a current PCP (inmate) and does not need an [facility] hospital follow-up appointment after discharge.  The patient does not have transportation limitations that hinder transportation to clinic appointments.  .Services Needed at time of discharge: Y = Yes, Blank = No PT:   OT:   RN:   Equipment:   Other:       Yolanda Manges, DO 11/09/2015, 10:13 AM

## 2015-11-09 NOTE — Progress Notes (Signed)
Subjective: This morning [name] continues to have chest pain that radiates down his left arm and he rates as a 10/10 and does not change in intensity with breathing. This is slightly changed from his chest pain that he had yesterday since he had chest pain that radiated to his left arm, and left leg. Today he does not have radiation to his left leg. He continues to have an occipital headache that is unchanged from yesterday, the pain from his headache radiates to the left side of his head.  Objective: Vital signs in last 24 hours: Filed Vitals:   11/09/15 0730 11/09/15 0807 11/09/15 0840 11/09/15 0850  BP: 141/105 135/98 138/112 129/94  Pulse: 101 93 85 94  Temp:   98.9 F (37.2 C)   TempSrc:   Oral   Resp: 16 16 22 21   Height:      Weight:      SpO2: 94% 95% 99% 95%   Weight change:   Intake/Output Summary (Last 24 hours) at 11/09/15 1112 Last data filed at 11/09/15 1000  Gross per 24 hour  Intake      0 ml  Output    600 ml  Net   -600 ml   Physical Exam  Gen: Well appearing in no acute distress CV: Regular rate and rhythm no murmurs heard on ausculation. Chest pain is not reproducible with palpation and does not change with respiration Lung: Clear bilaterally to ausculation, no increased work of breathing at rest or pleuritic chest pain. Abdominal: Soft, non-distended. Bowel sounds present, no rigidity present Neuro. CN 2-12 intact, some mild lightheadness with EOM in the left visual field that was reproducible. Decreased sensation on left side of face and arm. Reflexes +2 in the upper and lower extremity.  Lab Results: CBC    Component Value Date/Time   WBC 5.6 11/08/2015 1754   RBC 4.82 11/08/2015 1754   HGB 13.3 11/08/2015 1754   HCT 39.8 11/08/2015 1754   PLT 163 11/08/2015 1754   MCV 82.6 11/08/2015 1754   MCH 27.6 11/08/2015 1754   MCHC 33.4 11/08/2015 1754   RDW 14.0 11/08/2015 1754   BMP Latest Ref Rng 11/09/2015 11/08/2015  Glucose 65 - 99 mg/dL 161(W430(H)  960(A129(H)  BUN 6 - 20 mg/dL 7 6  Creatinine 5.400.61 - 1.24 mg/dL 9.811.21 1.910.97  Sodium 478135 - 145 mmol/L 133(L) 137  Potassium 3.5 - 5.1 mmol/L 4.0 3.6  Chloride 101 - 111 mmol/L 99(L) 103  CO2 22 - 32 mmol/L 25 27  Calcium 8.9 - 10.3 mg/dL 8.9 9.4    Micro Results: No results found for this or any previous visit (from the past 240 hour(s)). Studies/Results: Dg Chest 2 View  11/08/2015  CLINICAL DATA:  Chest pain EXAM: CHEST  2 VIEW COMPARISON:  None. FINDINGS: Normal heart size. Normal mediastinal contour. No pneumothorax. No pleural effusion. Lungs appear clear, with no acute consolidative airspace disease and no pulmonary edema. IMPRESSION: No active cardiopulmonary disease. Electronically Signed   By: Delbert PhenixJason A Poff M.D.   On: 11/08/2015 18:57   Ct Head Wo Contrast  11/08/2015  CLINICAL DATA:  Headache, hypertension, dizziness. Symptom onset this afternoon. EXAM: CT HEAD WITHOUT CONTRAST TECHNIQUE: Contiguous axial images were obtained from the base of the skull through the vertex without intravenous contrast. COMPARISON:  None. FINDINGS: Brain: No evidence of acute infarction, hemorrhage, extra-axial collection, ventriculomegaly, or mass effect. Vascular: No hyperdense vessel or unexpected calcification. Skull: Negative for fracture or focal lesion. Sinuses/Orbits: No acute  findings. Other: Suspect small subgaleal hematoma about the left posterior parietal scalp, age-indeterminate. IMPRESSION: No acute intracranial abnormality. Electronically Signed   By: Rubye Oaks M.D.   On: 11/08/2015 21:17   Medications:  Scheduled Meds: . aspirin  81 mg Oral q morning - 10a  . cloNIDine  0.1 mg Oral Daily  . enoxaparin (LOVENOX) injection  40 mg Subcutaneous Q24H  . gabapentin  300 mg Oral TID  . insulin aspart  0-15 Units Subcutaneous TID WC  . insulin aspart  0-5 Units Subcutaneous QHS  . insulin glargine  30 Units Subcutaneous QHS  . nitroGLYCERIN      . pantoprazole  20 mg Oral q morning - 10a  .  pravastatin  40 mg Oral QHS  . ranolazine  1,000 mg Oral Q12H  . sodium chloride flush  3 mL Intravenous Q12H   Continuous Infusions:  PRN Meds:.nitroGLYCERIN, ondansetron **OR** ondansetron (ZOFRAN) IV Assessment/Plan: Principal Problem:   Hypertensive urgency Active Problems:   Type 2 diabetes mellitus (HCC)   History of pulmonary embolism   History of MI (myocardial infarction)   Chest pain  Hypertensive Urgency: Patient hypertensive to 230/130 with symptoms of left sided chest pain, posterior neck pain, occipital headache, and numbness/tingling down left arm that continues this morning. We are holding his nitro drip to prevent further exacerbation of his headache and continue to monitor for possible conversion to HTN emergency if there is evidence/symptoms of end organ damage.  -Stress myoview pending per cardiology recommendations. -F/u Renal U/S for concern for renal artery stenosis. -Holding BP meds and using only clonidine 0.1mg  for systolic BP control 140-110mm Hg . -Troponins have been negative so far at < 0.03 and repeat troponin today also negative. UDS, and TSH within normal limits. -HIV antibody pending   T2DM: Prescribed Metformin 1000 mg, Lantus 35u qhs, and Humulin sliding scale. Maintain CBG under 200. Last CBG was 430.  -Continue SSI while in hospital.   Hx of MI: Reports MI 12-14 years ago. Had a cath, but unsure about why stenting was done, he states having a cath 3 years ago and another one 1 year ago. We have no records on file, appreciate cardiology following up for recs and obtaining records from OSH. -Ranexa 1000 mg q12h  -Pravastatin 20 mg qd -ASA 81 mg   Diet: Heart  DVT ppx: Lovenox  Code: FULL   Dispo: Disposition is deferred at this time, awaiting improvement of current medical problems. Anticipated discharge in approximately 1-2 day(s).   The patient does not have a current PCP (No Pcp Per Patient) and does not need an [facility] hospital follow-up  appointment after discharge.  The patient does have transportation limitations that hinder transportation to clinic appointments.  This is a Psychologist, occupational Note.  The care of the patient was discussed with Dr. Andrey Campanile and the assessment and plan formulated with their assistance.  Please see their attached note for official documentation of the daily encounter.     Kandyce Rud., Med Student 11/09/2015, 11:12 AM

## 2015-11-09 NOTE — Consult Note (Addendum)
Reason for Consult:   Chest pain  Requesting Physician: Dr Criselda PeachesMullen Primary Cardiologist ?  HPI:   55 year old AA male with PMH of HTN, T2DM, MI 2013, and PE 2014 who was admitted 11/08/15 with accelerated hypertension, chest pain, uncontrolled DM, and headache. The pt says he had a cath at [facility] in 2013. No other details provided, he doesn't know if he received a stent. He was on Ranexa prior to admission.  [facility]. He has been there 2 months, he was transferred there from another facility. The pt describes a pounding left sided chest pain. He has had partial relief with NTG. He says this pain occurs when his blood pressures get high. He believes his normal systolic blood pressures range in the 140s - 150s. He takes Amlodipine, Chlorthalidone, Clonidine, Hydralazine, Lisinopril, and Metoprolol. He says he has not missed any doses as they administer and count pills at his institution.  Per nursing note, EMS was called and administered ASA 324 mg and nitro x 5 without relief. His BP was 230/130 in the field. He was started on a nitroglycerin infusion in the ED but has since been stopped secondary to complaints of headache and the fact that his Troponin is negative x 4. He still complains of mid sternal pain but not as severe as on admission.    PMHx:  Past Medical History  Diagnosis Date  . Myocardial infarction (HCC) 2013  . Diabetes mellitus without complication (HCC)   . Hypertension   . Pulmonary embolism (HCC) 2014    Past Surgical History  Procedure Laterality Date  . Cardiac catheterization  2013    SOCHx:  reports that he has quit smoking. He does not have any smokeless tobacco history on file. He reports that he does not drink alcohol or use illicit drugs.  FAMHx: Family History  Problem Relation Age of Onset  . Heart disease Mother   . Heart disease Mother   . Stroke Mother   . Diabetes Sister     . Diabetes Brother   . Hypertension Sister   . Hypertension Brother     ALLERGIES: No Known Allergies  ROS: Review of Systems: General: negative for chills, fever, night sweats or weight changes.  Cardiovascular: negative for dyspnea on exertion, edema, orthopnea, palpitations, paroxysmal nocturnal dyspnea or shortness of breath HEENT: negative for any visual disturbances, blindness, glaucoma Dermatological: negative for rash Respiratory: negative for cough, hemoptysis, or wheezing Urologic: negative for hematuria or dysuria Abdominal: negative for nausea, vomiting, diarrhea, bright red blood per rectum, melena, or hematemesis Neurologic: negative for visual changes, syncope, or dizziness Musculoskeletal: negative for back pain, joint pain, or swelling Psych: cooperative and appropriate All other systems reviewed and are otherwise negative except as noted above.   HOME MEDICATIONS: Prior to Admission medications   Medication Sig Start Date End Date Taking? Authorizing Provider  amLODipine (NORVASC) 10 MG tablet Take 10 mg by mouth every morning.   Yes Historical Provider, MD  aspirin 81 MG chewable tablet Chew 81 mg by mouth every morning.   Yes Historical Provider, MD  chlorthalidone (HYGROTON) 25 MG tablet Take 25 mg by mouth every morning.   Yes Historical Provider, MD  cloNIDine (CATAPRES) 0.1 MG tablet Take 0.1 mg by mouth every 6 (six) hours. Hold if systolic blood pressure <160 and dystolic blood pressure <90   Yes Historical Provider, MD  gabapentin (NEURONTIN) 300 MG  capsule Take 300 mg by mouth 3 (three) times daily.   Yes Historical Provider, MD  hydrALAZINE (APRESOLINE) 50 MG tablet Take 50 mg by mouth every 8 (eight) hours.    Yes Historical Provider, MD  insulin glargine (LANTUS) 100 UNIT/ML injection Inject 35 Units into the skin at bedtime.   Yes Historical Provider, MD  insulin regular (HUMULIN R) 100 units/mL injection Inject 2-12 Units into the skin 3 (three)  times daily before meals. Per sliding scale: 200-249 = 2 units; 250-299 = 4 units; 300-349 = 6 units; 350-399 = 8 units; 400-499 = 10 units; 500-549 = 12 units; call provider if >550   Yes Historical Provider, MD  lisinopril (PRINIVIL,ZESTRIL) 40 MG tablet Take 40 mg by mouth every morning.  09/23/15  Yes Historical Provider, MD  metFORMIN (GLUCOPHAGE) 1000 MG tablet Take 1,000 mg by mouth 2 (two) times daily with a meal.   Yes Historical Provider, MD  metoprolol (LOPRESSOR) 100 MG tablet Take 100 mg by mouth every 12 (twelve) hours.   Yes Historical Provider, MD  pantoprazole (PROTONIX) 20 MG tablet Take 20 mg by mouth every morning.   Yes Historical Provider, MD  pravastatin (PRAVACHOL) 40 MG tablet Take 40 mg by mouth at bedtime.   Yes Historical Provider, MD  ranolazine (RANEXA) 500 MG 12 hr tablet Take 1,000 mg by mouth every 12 (twelve) hours.   Yes Historical Provider, MD    HOSPITAL MEDICATIONS: I have reviewed the patient's current medications.  VITALS: Blood pressure 129/94, pulse 94, temperature 98.9 F (37.2 C), temperature source Oral, resp. rate 21, height 6\' 1"  (1.854 m), weight 556 lb 10.6 oz (252.5 kg), SpO2 95 %.  PHYSICAL EXAM: General appearance: alert, cooperative, no distress and moderately obese Neck: no carotid bruit and no JVD Lungs: clear to auscultation bilaterally Heart: regular rate and rhythm and 2/6 mid systolic murmur AOV and LSB, preserved S2 Abdomen: soft, non-tender; bowel sounds normal; no masses,  no organomegaly Extremities: extremities normal, atraumatic, no cyanosis or edema Pulses: 2+ and symmetric Skin: Skin color, texture, turgor normal. No rashes or lesions Neurologic: Grossly normal  LABS: Results for orders placed or performed during the hospital encounter of 11/08/15 (from the past 24 hour(s))  Basic metabolic panel     Status: Abnormal   Collection Time: 11/08/15  5:54 PM  Result Value Ref Range   Sodium 137 135 - 145 mmol/L   Potassium  3.6 3.5 - 5.1 mmol/L   Chloride 103 101 - 111 mmol/L   CO2 27 22 - 32 mmol/L   Glucose, Bld 129 (H) 65 - 99 mg/dL   BUN 6 6 - 20 mg/dL   Creatinine, Ser 6.29 0.61 - 1.24 mg/dL   Calcium 9.4 8.9 - 52.8 mg/dL   GFR calc non Af Amer >60 >60 mL/min   GFR calc Af Amer >60 >60 mL/min   Anion gap 7 5 - 15  CBC     Status: None   Collection Time: 11/08/15  5:54 PM  Result Value Ref Range   WBC 5.6 4.0 - 10.5 K/uL   RBC 4.82 4.22 - 5.81 MIL/uL   Hemoglobin 13.3 13.0 - 17.0 g/dL   HCT 41.3 24.4 - 01.0 %   MCV 82.6 78.0 - 100.0 fL   MCH 27.6 26.0 - 34.0 pg   MCHC 33.4 30.0 - 36.0 g/dL   RDW 27.2 53.6 - 64.4 %   Platelets 163 150 - 400 K/uL  I-stat troponin, ED  Status: None   Collection Time: 11/08/15  6:05 PM  Result Value Ref Range   Troponin i, poc 0.00 0.00 - 0.08 ng/mL   Comment 3          Troponin I (q 6hr x 3)     Status: None   Collection Time: 11/08/15 10:38 PM  Result Value Ref Range   Troponin I <0.03 <0.031 ng/mL  Hepatic function panel     Status: Abnormal   Collection Time: 11/08/15 10:38 PM  Result Value Ref Range   Total Protein 6.5 6.5 - 8.1 g/dL   Albumin 3.5 3.5 - 5.0 g/dL   AST 16 15 - 41 U/L   ALT 17 17 - 63 U/L   Alkaline Phosphatase 66 38 - 126 U/L   Total Bilirubin 0.2 (L) 0.3 - 1.2 mg/dL   Bilirubin, Direct <1.6 (L) 0.1 - 0.5 mg/dL   Indirect Bilirubin NOT CALCULATED 0.3 - 0.9 mg/dL  TSH     Status: None   Collection Time: 11/08/15 10:38 PM  Result Value Ref Range   TSH 0.754 0.350 - 4.500 uIU/mL  Lipid panel     Status: Abnormal   Collection Time: 11/08/15 10:38 PM  Result Value Ref Range   Cholesterol 182 0 - 200 mg/dL   Triglycerides 96 <109 mg/dL   HDL 41 >60 mg/dL   Total CHOL/HDL Ratio 4.4 RATIO   VLDL 19 0 - 40 mg/dL   LDL Cholesterol 454 (H) 0 - 99 mg/dL  Urine rapid drug screen (hosp performed)     Status: None   Collection Time: 11/08/15 11:31 PM  Result Value Ref Range   Opiates NONE DETECTED NONE DETECTED   Cocaine NONE DETECTED  NONE DETECTED   Benzodiazepines NONE DETECTED NONE DETECTED   Amphetamines NONE DETECTED NONE DETECTED   Tetrahydrocannabinol NONE DETECTED NONE DETECTED   Barbiturates NONE DETECTED NONE DETECTED  Urinalysis, Routine w reflex microscopic (not at [facility])     Status: Abnormal   Collection Time: 11/08/15 11:31 PM  Result Value Ref Range   Color, Urine YELLOW YELLOW   APPearance CLOUDY (A) CLEAR   Specific Gravity, Urine 1.035 (H) 1.005 - 1.030   pH 5.5 5.0 - 8.0   Glucose, UA >1000 (A) NEGATIVE mg/dL   Hgb urine dipstick NEGATIVE NEGATIVE   Bilirubin Urine NEGATIVE NEGATIVE   Ketones, ur 15 (A) NEGATIVE mg/dL   Protein, ur 30 (A) NEGATIVE mg/dL   Nitrite NEGATIVE NEGATIVE   Leukocytes, UA NEGATIVE NEGATIVE  Urine microscopic-add on     Status: Abnormal   Collection Time: 11/08/15 11:31 PM  Result Value Ref Range   Squamous Epithelial / LPF 0-5 (A) NONE SEEN   WBC, UA 0-5 0 - 5 WBC/hpf   RBC / HPF 0-5 0 - 5 RBC/hpf   Bacteria, UA FEW (A) NONE SEEN   Urine-Other MUCOUS PRESENT   Troponin I (q 6hr x 3)     Status: None   Collection Time: 11/09/15  4:09 AM  Result Value Ref Range   Troponin I <0.03 <0.031 ng/mL  CBG monitoring, ED     Status: Abnormal   Collection Time: 11/09/15  7:43 AM  Result Value Ref Range   Glucose-Capillary 448 (H) 65 - 99 mg/dL  Basic metabolic panel     Status: Abnormal   Collection Time: 11/09/15  8:00 AM  Result Value Ref Range   Sodium 133 (L) 135 - 145 mmol/L   Potassium 4.0 3.5 - 5.1 mmol/L  Chloride 99 (L) 101 - 111 mmol/L   CO2 25 22 - 32 mmol/L   Glucose, Bld 430 (H) 65 - 99 mg/dL   BUN 7 6 - 20 mg/dL   Creatinine, Ser 1.61 0.61 - 1.24 mg/dL   Calcium 8.9 8.9 - 09.6 mg/dL   GFR calc non Af Amer >60 >60 mL/min   GFR calc Af Amer >60 >60 mL/min   Anion gap 9 5 - 15  Glucose, capillary     Status: Abnormal   Collection Time: 11/09/15  8:52 AM  Result Value Ref Range   Glucose-Capillary 474 (H) 65 - 99 mg/dL  Troponin I (q 6hr x 3)      Status: None   Collection Time: 11/09/15  9:47 AM  Result Value Ref Range   Troponin I <0.03 <0.031 ng/mL    EKG: NSR, NSST changes  IMAGING: Dg Chest 2 View  11/08/2015  CLINICAL DATA:  Chest pain EXAM: CHEST  2 VIEW COMPARISON:  None. FINDINGS: Normal heart size. Normal mediastinal contour. No pneumothorax. No pleural effusion. Lungs appear clear, with no acute consolidative airspace disease and no pulmonary edema. IMPRESSION: No active cardiopulmonary disease. Electronically Signed   By: Delbert Phenix M.D.   On: 11/08/2015 18:57   Ct Head Wo Contrast  11/08/2015  CLINICAL DATA:  Headache, hypertension, dizziness. Symptom onset this afternoon. EXAM: CT HEAD WITHOUT CONTRAST TECHNIQUE: Contiguous axial images were obtained from the base of the skull through the vertex without intravenous contrast. COMPARISON:  None. FINDINGS: Brain: No evidence of acute infarction, hemorrhage, extra-axial collection, ventriculomegaly, or mass effect. Vascular: No hyperdense vessel or unexpected calcification. Skull: Negative for fracture or focal lesion. Sinuses/Orbits: No acute findings. Other: Suspect small subgaleal hematoma about the left posterior parietal scalp, age-indeterminate. IMPRESSION: No acute intracranial abnormality. Electronically Signed   By: Rubye Oaks M.D.   On: 11/08/2015 21:17    IMPRESSION: Principal Problem:   Hypertensive urgency Active Problems:   Chest pain with moderate risk of acute coronary syndrome   Type 2 diabetes mellitus, uncontrolled (HCC)   History of MI (myocardial infarction)   History of pulmonary embolism 2013   RECOMMENDATION: Will try and obtain records from [facility], further recommendations to follow.   Time Spent Directly with Patient: 45 minutes  Corine Shelter, [place]  [phone] beeper 11/09/2015, 11:53 AM    Addendum: Records from [facility] reviewed. The pt had chest pain while incarcerated in early Jan 2013. He had a cath showing  minor (<25%) CAD and normal LVF. He was admitted two weeks later with chest pain and diagnosed with bilateral lower lobe PE.  Will order Myoview but will check D-dimer as well.   Corine Shelter PA-C 11/09/2015 2:14 PM The patient has been seen in conjunction with Corine Shelter, PAC. All aspects of care have been considered and discussed. The patient has been personally interviewed, examined, and all clinical data has been reviewed.   He has continuous pain and no ECG change or marker elevation. Suspect this will be nonischemic chest pain.  Cath in 2013, nonobstructive disease.  Plan stress myoview.  Better BP control is needed . Consider low-dose thiazide diuretic

## 2015-11-10 ENCOUNTER — Observation Stay (HOSPITAL_COMMUNITY)

## 2015-11-10 DIAGNOSIS — R42 Dizziness and giddiness: Secondary | ICD-10-CM | POA: Diagnosis not present

## 2015-11-10 DIAGNOSIS — R079 Chest pain, unspecified: Secondary | ICD-10-CM | POA: Diagnosis not present

## 2015-11-10 DIAGNOSIS — E119 Type 2 diabetes mellitus without complications: Secondary | ICD-10-CM

## 2015-11-10 DIAGNOSIS — I10 Essential (primary) hypertension: Secondary | ICD-10-CM

## 2015-11-10 DIAGNOSIS — I160 Hypertensive urgency: Secondary | ICD-10-CM | POA: Diagnosis not present

## 2015-11-10 DIAGNOSIS — Z794 Long term (current) use of insulin: Secondary | ICD-10-CM

## 2015-11-10 DIAGNOSIS — E1165 Type 2 diabetes mellitus with hyperglycemia: Secondary | ICD-10-CM | POA: Diagnosis not present

## 2015-11-10 DIAGNOSIS — I25119 Atherosclerotic heart disease of native coronary artery with unspecified angina pectoris: Secondary | ICD-10-CM | POA: Diagnosis not present

## 2015-11-10 DIAGNOSIS — R51 Headache: Secondary | ICD-10-CM

## 2015-11-10 DIAGNOSIS — R9431 Abnormal electrocardiogram [ECG] [EKG]: Secondary | ICD-10-CM | POA: Diagnosis present

## 2015-11-10 DIAGNOSIS — Z7982 Long term (current) use of aspirin: Secondary | ICD-10-CM

## 2015-11-10 DIAGNOSIS — Z86711 Personal history of pulmonary embolism: Secondary | ICD-10-CM

## 2015-11-10 LAB — BASIC METABOLIC PANEL
ANION GAP: 7 (ref 5–15)
BUN: 8 mg/dL (ref 6–20)
CALCIUM: 8.9 mg/dL (ref 8.9–10.3)
CO2: 26 mmol/L (ref 22–32)
Chloride: 103 mmol/L (ref 101–111)
Creatinine, Ser: 0.97 mg/dL (ref 0.61–1.24)
GFR calc Af Amer: >60 mL/min (ref >60)
GFR calc non Af Amer: >60 mL/min (ref >60)
GLUCOSE: 162 mg/dL — AB (ref 65–99)
Potassium: 3.5 mmol/L (ref 3.5–5.1)
Sodium: 136 mmol/L (ref 135–145)

## 2015-11-10 LAB — GLUCOSE, CAPILLARY
GLUCOSE-CAPILLARY: 235 mg/dL — AB (ref 65–99)
GLUCOSE-CAPILLARY: 269 mg/dL — AB (ref 65–99)
Glucose-Capillary: 290 mg/dL — ABNORMAL HIGH (ref 65–99)
Glucose-Capillary: 383 mg/dL — ABNORMAL HIGH (ref 65–99)
Glucose-Capillary: 166 mg/dL — ABNORMAL HIGH (ref 65–99)
Glucose-Capillary: 318 mg/dL — ABNORMAL HIGH (ref 65–99)

## 2015-11-10 LAB — NM MYOCAR MULTI W/SPECT W/WALL MOTION / EF
Exercise duration (min): 4 min
Exercise duration (sec): 0 s
Peak HR: 105 {beats}/min
Rest HR: 75 {beats}/min

## 2015-11-10 LAB — HEMOGLOBIN A1C
Hgb A1c MFr Bld: 11.2 % — ABNORMAL HIGH (ref 4.8–5.6)
Mean Plasma Glucose: 275 mg/dL

## 2015-11-10 MED ORDER — HYDRALAZINE HCL 20 MG/ML IJ SOLN
10.0000 mg | Freq: Once | INTRAMUSCULAR | Status: AC
  Administered 2015-11-10: 10 mg via INTRAVENOUS

## 2015-11-10 MED ORDER — TECHNETIUM TC 99M TETROFOSMIN IV KIT
30.0000 | PACK | Freq: Once | INTRAVENOUS | Status: AC | PRN
  Administered 2015-11-10: 30 via INTRAVENOUS

## 2015-11-10 MED ORDER — NITROGLYCERIN 0.4 MG SL SUBL: 0.4000 mg | SUBLINGUAL_TABLET | SUBLINGUAL | Status: DC | PRN

## 2015-11-10 MED ORDER — SPIRONOLACTONE 25 MG PO TABS
25.0000 mg | ORAL_TABLET | Freq: Every day | ORAL | Status: DC
  Administered 2015-11-10 – 2015-11-13 (×4): 25 mg via ORAL
  Filled 2015-11-10 (×4): qty 1

## 2015-11-10 MED ORDER — HYDRALAZINE HCL 20 MG/ML IJ SOLN
INTRAMUSCULAR | Status: AC
  Filled 2015-11-10: qty 1

## 2015-11-10 MED ORDER — REGADENOSON 0.4 MG/5ML IV SOLN
0.4000 mg | Freq: Once | INTRAVENOUS | Status: AC
  Administered 2015-11-10: 0.4 mg via INTRAVENOUS
  Filled 2015-11-10: qty 5

## 2015-11-10 MED ORDER — NITROGLYCERIN 0.4 MG SL SUBL
SUBLINGUAL_TABLET | SUBLINGUAL | Status: AC
  Filled 2015-11-10: qty 1

## 2015-11-10 MED ORDER — METOPROLOL TARTRATE 25 MG PO TABS
25.0000 mg | ORAL_TABLET | Freq: Two times a day (BID) | ORAL | Status: DC
  Administered 2015-11-10: 25 mg via ORAL
  Filled 2015-11-10: qty 1

## 2015-11-10 MED ORDER — OXYCODONE HCL 5 MG PO TABS
5.0000 mg | ORAL_TABLET | ORAL | Status: DC | PRN
  Administered 2015-11-10 – 2015-11-13 (×10): 5 mg via ORAL
  Filled 2015-11-10 (×10): qty 1

## 2015-11-10 MED ORDER — LABETALOL HCL 5 MG/ML IV SOLN
10.0000 mg | Freq: Once | INTRAVENOUS | Status: AC
  Administered 2015-11-10: 10 mg via INTRAVENOUS
  Filled 2015-11-10: qty 4

## 2015-11-10 MED ORDER — HYDRALAZINE HCL 20 MG/ML IJ SOLN
10.0000 mg | Freq: Four times a day (QID) | INTRAMUSCULAR | Status: DC | PRN
  Administered 2015-11-10 – 2015-11-11 (×3): 10 mg via INTRAVENOUS
  Filled 2015-11-10 (×3): qty 1

## 2015-11-10 MED ORDER — DIPHENHYDRAMINE HCL 50 MG/ML IJ SOLN
25.0000 mg | Freq: Once | INTRAMUSCULAR | Status: AC
  Administered 2015-11-10: 25 mg via INTRAVENOUS
  Filled 2015-11-10: qty 1

## 2015-11-10 MED ORDER — CHLORTHALIDONE 25 MG PO TABS: 25.0000 mg | ORAL_TABLET | Freq: Every morning | ORAL | Status: DC

## 2015-11-10 MED ORDER — ACETAMINOPHEN 325 MG PO TABS
650.0000 mg | ORAL_TABLET | Freq: Four times a day (QID) | ORAL | Status: DC | PRN
  Administered 2015-11-11: 650 mg via ORAL
  Filled 2015-11-10: qty 2

## 2015-11-10 MED ORDER — TECHNETIUM TC 99M TETROFOSMIN IV KIT
10.0000 | PACK | Freq: Once | INTRAVENOUS | Status: AC | PRN
  Administered 2015-11-10: 10 via INTRAVENOUS

## 2015-11-10 MED ORDER — CHLORTHALIDONE 25 MG PO TABS
25.0000 mg | ORAL_TABLET | Freq: Every morning | ORAL | Status: DC
  Administered 2015-11-10: 25 mg via ORAL
  Filled 2015-11-10 (×2): qty 1

## 2015-11-10 MED ORDER — METOPROLOL TARTRATE 50 MG PO TABS
50.0000 mg | ORAL_TABLET | Freq: Two times a day (BID) | ORAL | Status: DC
  Administered 2015-11-10 – 2015-11-13 (×6): 50 mg via ORAL
  Filled 2015-11-10 (×6): qty 1

## 2015-11-10 MED ORDER — METOCLOPRAMIDE HCL 5 MG/ML IJ SOLN
10.0000 mg | Freq: Once | INTRAMUSCULAR | Status: AC
  Administered 2015-11-10: 10 mg via INTRAVENOUS
  Filled 2015-11-10: qty 2

## 2015-11-10 MED ORDER — FUROSEMIDE 40 MG PO TABS
40.0000 mg | ORAL_TABLET | Freq: Every day | ORAL | Status: DC
  Administered 2015-11-10 – 2015-11-13 (×4): 40 mg via ORAL
  Filled 2015-11-10 (×4): qty 1

## 2015-11-10 MED ORDER — REGADENOSON 0.4 MG/5ML IV SOLN
INTRAVENOUS | Status: AC
  Filled 2015-11-10: qty 5

## 2015-11-10 NOTE — Progress Notes (Addendum)
       Patient Name: Raymond Crane Date of Encounter: 11/11/2015    SUBJECTIVE: Intermediate risk myocardial perfusion study yesterday. Blood pressures are still very poorly controlled. The current regimen is amlodipine 10 mg daily, chlorthalidone 25 mg daily, clonidine 0.1 mg daily, hydralazine 50 mg 3 times a day, metoprolol 50 mg twice a day (increased yesterday).  TELEMETRY:  Normal sinus rhythm: Filed Vitals:   11/11/15 0430 11/11/15 0500 11/11/15 0630 11/11/15 0740  BP:    155/108  Pulse:    100  Temp:    98.5 F (36.9 C)  TempSrc:    Oral  Resp: 14 15 12    Height:      Weight:   248 lb (112.492 kg)   SpO2:    95%    Intake/Output Summary (Last 24 hours) at 11/11/15 0849 Last data filed at 11/11/15 0423  Gross per 24 hour  Intake   1203 ml  Output   1550 ml  Net   -347 ml   LABS: Basic Metabolic Panel:  Recent Labs  16/03/9604/25/17 0753 11/11/15 0345  NA 136 133*  K 3.5 3.6  CL 103 99*  CO2 26 24  GLUCOSE 162* 268*  BUN 8 7  CREATININE 0.97 1.12  CALCIUM 8.9 9.7   CBC:  Recent Labs  11/08/15 1754  WBC 5.6  HGB 13.3  HCT 39.8  MCV 82.6  PLT 163   Cardiac Enzymes:  Recent Labs  11/08/15 2238 11/09/15 0409 11/09/15 0947  TROPONINI <0.03 <0.03 <0.03   BNP: Invalid input(s): POCBNP Hemoglobin A1C:  Recent Labs  11/08/15 2238  HGBA1C 11.2*   Fasting Lipid Panel:  Recent Labs  11/08/15 2238  CHOL 182  HDL 41  LDLCALC 122*  TRIG 96  CHOLHDL 4.4    Radiology/Studies:  No new data  Physical Exam: Blood pressure 155/108, pulse 100, temperature 98.5 F (36.9 C), temperature source Oral, resp. rate 12, height 6\' 1"  (1.854 m), weight 248 lb (112.492 kg), SpO2 95 %. Weight change: -308 lb 10.6 oz (-140.008 kg)  Wt Readings from Last 3 Encounters:  11/11/15 248 lb (112.492 kg)   No acute distress Clipping of chest wall soreness. Not able to elicit any triggerpoints by my exam. Cardiac exam reveals no S4 gallop Lungs are  clear Extremities are without edema  ASSESSMENT:  1. Essential Hypertension-Very poor blood pressure control. Consider renal artery stenosis. 2. Coronary artery disease  - Intermediate risk myocardial perfusion study.  3. Diabetes mellitus, 2 with complications, needs aggressive control  Plan:  1. Plan to control blood pressure before any further workup for myocardial ischemia. The nuclear study does not demonstrate any evidence of large ischemic burden. If has angina with good blood pressure control, will need to have coronary angiography. 2. With reference to blood pressure control, I would be more aggressive with diuretic therapy by switching from thiazide diuretic to a loop diuretic and adding Aldactone. 3. Aggressive up titration of beta blocker therapy in this setting would be beneficial for both CAD and hypertension. 4. Once blood pressure reasonably controlled, the patient will be eligible for discharge. 5. Consider discontinuing ranolazine  Signed, Lyn RecordsHenry W Smith III 11/11/2015, 8:49 AM

## 2015-11-10 NOTE — Progress Notes (Signed)
   I have reviewed the myocardial perfusion images. This study was interpreted as intermediate risk. No significant regions of ischemia are noted.  I would recommend optimization of medical therapy for hypertension. If recurring chest pain after a good medical regimen is started, then perhaps coronary angiography will be necessary. I would hold off on that for the time being.  I would recommend increasing beta blocker and considering ARB therapy if kidney function remains stable after contrast load.

## 2015-11-10 NOTE — Progress Notes (Signed)
   If the myocardial perfusion study is intermediate or low risk, we will continue medical therapy. We should only need to perform coronary angiography and there is evidence of high risk involving anterior wall ischemia.  Blood pressure control is a significant problem. Given the patient's diabetes, ACE/ARB therapy should be instituted.  I will follow-up after results of the nuclear study are known.

## 2015-11-10 NOTE — Progress Notes (Signed)
Inpatient Diabetes Program Recommendations  AACE/ADA: New Consensus Statement on Inpatient Glycemic Control (2015)  Target Ranges:  Prepandial:   less than 140 mg/dL      Peak postprandial:   less than 180 mg/dL (1-2 hours)      Critically ill patients:  140 - 180 mg/dL  Results for Raymond Crane, Artin (MRN [id number]) as of 11/10/2015 11:40  Ref. Range 11/09/2015 08:52 11/09/2015 13:27 11/09/2015 16:44 11/09/2015 21:31 11/10/2015 07:19  Glucose-Capillary Latest Ref Range: 65-99 mg/dL 478474 (H) 295423 (H) 621290 (H) 383 (H) 166 (H)  Results for Raymond Crane, Raymond Crane (MRN [id number]) as of 11/10/2015 11:40  Ref. Range 11/08/2015 22:38  Hemoglobin A1C Latest Ref Range: 4.8-5.6 % 11.2 (H)   Review of Glycemic Control  Diabetes history: DM 2 Outpatient Diabetes medications: Lantus 35 units q hs + Humulin R correction tid with meals + Metformin 1 gm bid Current orders for Inpatient glycemic control: Lantus 30 units q hs + Novolog correction 0-15 units tid with meals + 0-5 units hs  Inpatient Diabetes Program Recommendations:    Please consider adding meal coverage 8 units tid with meals (hold if eats <50%). Post prandial CBGs elevated. Noted A1c 11.2. Will follow.  Thank you, Billy FischerJudy E. Mikena Masoner, RN, MSN, CDE Inpatient Glycemic Control Team Team Pager [phone] (8am-5pm) 11/10/2015 11:57 AM

## 2015-11-10 NOTE — Progress Notes (Signed)
Subjective:  Still complaining of chest pain  Objective:  Vital Signs in the last 24 hours: Temp:  [97.8 F (36.6 C)-98.8 F (37.1 C)] 98.8 F (37.1 C) (05/25 0514) Pulse Rate:  [60-78] 75 (05/25 0927) Resp:  [12-22] 16 (05/25 0927) BP: (139-199)/(86-117) 199/101 mmHg (05/25 0927) SpO2:  [93 %-100 %] 97 % (05/25 0514) Weight:  [250 lb 14.4 oz (113.807 kg)-556 lb 10.6 oz (252.5 kg)] 250 lb 14.4 oz (113.807 kg) (05/25 0514)  Intake/Output from previous day:  Intake/Output Summary (Last 24 hours) at 11/10/15 0938 Last data filed at 11/09/15 1939  Gross per 24 hour  Intake    240 ml  Output   1700 ml  Net  -1460 ml    Physical Exam: General appearance: alert, cooperative and no distress Neck: no carotid bruit and no JVD Lungs: clear to auscultation bilaterally Heart: regular rate and rhythm Extremities: no edema Neurologic: Grossly normal   Rate: 75  Rhythm: normal sinus rhythm  Lab Results:  Recent Labs  11/08/15 1754  WBC 5.6  HGB 13.3  PLT 163    Recent Labs  11/09/15 0800 11/10/15 0753  NA 133* 136  K 4.0 3.5  CL 99* 103  CO2 25 26  GLUCOSE 430* 162*  BUN 7 8  CREATININE 1.21 0.97    Recent Labs  11/09/15 0409 11/09/15 0947  TROPONINI <0.03 <0.03   No results for input(s): INR in the last 72 hours.  Scheduled Meds: . amLODipine  10 mg Oral q morning - 10a  . aspirin  81 mg Oral q morning - 10a  . cloNIDine  0.1 mg Oral Daily  . enoxaparin (LOVENOX) injection  40 mg Subcutaneous Q24H  . gabapentin  300 mg Oral TID  . hydrALAZINE  50 mg Oral Q8H  . insulin aspart  0-15 Units Subcutaneous TID WC  . insulin aspart  0-5 Units Subcutaneous QHS  . insulin glargine  30 Units Subcutaneous QHS  . pantoprazole  20 mg Oral q morning - 10a  . pneumococcal 23 valent vaccine  0.5 mL Intramuscular Tomorrow-1000  . pravastatin  40 mg Oral QHS  . ranolazine  1,000 mg Oral Q12H  . regadenoson      . regadenoson  0.4 mg Intravenous Once  . sodium  chloride flush  3 mL Intravenous Q12H   Continuous Infusions:  PRN Meds:.morphine injection, nitroGLYCERIN, ondansetron **OR** ondansetron (ZOFRAN) IV   Imaging: Imaging results have been reviewed  Cardiac Studies: Myoview pending  Assessment/Plan:  55 year old incarcerated AA male with PMH of HTN, T2DM, who was admitted 11/08/15 with accelerated hypertension, chest pain, uncontrolled DM. He had a cath in 2013 which showed no significant CAD. Two weeks later he was admitted with bilateral LL PE.   Principal Problem:   Hypertensive urgency Active Problems:   Chest pain with moderate risk of acute coronary syndrome   Abnormal EKG-inferior TWI   Type 2 diabetes mellitus, uncontrolled (HCC)   History of MI (myocardial infarction)   CAD - minor CAD at cath Jan 2013   Essential hypertension   History of pulmonary embolism 2013   Cephalalgia   PLAN: Myoview today. B/P still poorly controlled. Troponin negative x 4 and D-dimer WNL.   Corine ShelterLuke Kilroy PA-C 11/10/2015, 9:38 AM [phone]  Addendum: Pt had exaggerated B/P and some exaggeration of his baseline ST depression in leads 3, AVF, V5, V6 with Lexiscan. He received a SL NTG and 10 mg IV Hydralazine for accelerated hypertension.  Total test time 15 minutes. B/P down to 195/ 123. Final images pending. Pt now tells me he was also hospitalized in [place]- will look for records.   The patient has been seen in conjunction with Corine Shelter, PA-C. All aspects of care have been considered and discussed. The patient has been personally interviewed, examined, and all clinical data has been reviewed.  We will await results of myocardial perfusion study. Seems to me that the major goal will be to get much better blood pressure control prior to discharge. I don't believe we will find much evidence of myocardial ischemia.

## 2015-11-10 NOTE — Progress Notes (Signed)
Md notified of pt's bp maintaining in the 200s/100s after getting both IV & PO hydralazine. New orders given. Will continue to monitor the pt. Sanda LingerMilam, Kento Gossman R, RN

## 2015-11-10 NOTE — Progress Notes (Signed)
Subjective: Overnight he continued to have chest pain but says it is decreased from yesterday. He rates his chest pain as a 7/10 today with pain radiating to his left arm. He continues to endorse dizziness and a headache that is unchanged from yesterday. The only new symptom he is having is nausea which developed overnight. Denies any vomiting, shortness of breath, changes in chest pain with breathing, or chest pain radiating to his back.  Objective: Vital signs in last 24 hours: Filed Vitals:   11/10/15 0950 11/10/15 0959 11/10/15 1008 11/10/15 1035  BP: 192/128 197/118 205/104 174/138  Pulse: 76 78 76 74  Temp:      TempSrc:      Resp: Height:      Weight:      SpO2:       Weight change: 136.833 kg (301 lb 10.6 oz)  Intake/Output Summary (Last 24 hours) at 11/10/15 1053 Last data filed at 11/10/15 0900  Gross per 24 hour  Intake    240 ml  Output   1100 ml  Net   -860 ml    Physical Exam Gen: In no acute distress, alert and oriented x3 CV: RRR, no murmurs heard on ausculation, no JVD Lungs: Clear bilaterally to ausculation Abdomen: Soft non distended, no pain with palpation on all four quadrants, bowel sounds present. Extremities: No peripheral edema noted   Lab Results: BMP Latest Ref Rng 11/10/2015 11/09/2015 11/08/2015  Glucose 65 - 99 mg/dL 161(W) 960(A) 540(J)  BUN 6 - 20 mg/dL Creatinine 0.61 - 1.24 mg/dL 8.11 9.14 7.82  Sodium 135 - 145 mmol/L 136 133(L) 137  Potassium 3.5 - 5.1 mmol/L 3.5 4.0 3.6  Chloride 101 - 111 mmol/L 103 99(L) 103  CO2 22 - 32 mmol/L Calcium 8.9 - 10.3 mg/dL 8.9 8.9 9.4    Micro Results: No results found for this or any previous visit (from the past 240 hour(s)). Studies/Results: Dg Chest 2 View  11/08/2015  CLINICAL DATA:  Chest pain EXAM: CHEST  2 VIEW COMPARISON:  None. FINDINGS: Normal heart size. Normal mediastinal contour. No pneumothorax. No pleural effusion. Lungs appear clear, with no acute  consolidative airspace disease and no pulmonary edema. IMPRESSION: No active cardiopulmonary disease. Electronically Signed   By: Delbert Phenix M.D.   On: 11/08/2015 18:57   Ct Head Wo Contrast  11/08/2015  CLINICAL DATA:  Headache, hypertension, dizziness. Symptom onset this afternoon. EXAM: CT HEAD WITHOUT CONTRAST TECHNIQUE: Contiguous axial images were obtained from the base of the skull through the vertex without intravenous contrast. COMPARISON:  None. FINDINGS: Brain: No evidence of acute infarction, hemorrhage, extra-axial collection, ventriculomegaly, or mass effect. Vascular: No hyperdense vessel or unexpected calcification. Skull: Negative for fracture or focal lesion. Sinuses/Orbits: No acute findings. Other: Suspect small subgaleal hematoma about the left posterior parietal scalp, age-indeterminate. IMPRESSION: No acute intracranial abnormality. Electronically Signed   By: Rubye Oaks M.D.   On: 11/08/2015 21:17   Medications: Scheduled Meds: . amLODipine  10 mg Oral q morning - 10a  . aspirin  81 mg Oral q morning - 10a  . cloNIDine  0.1 mg Oral Daily  . enoxaparin (LOVENOX) injection  40 mg Subcutaneous Q24H  . gabapentin  300 mg Oral TID  . hydrALAZINE      . hydrALAZINE  50 mg Oral Q8H  . insulin aspart  0-15 Units Subcutaneous TID WC  . insulin aspart  0-5 Units  Subcutaneous QHS  . insulin glargine  30 Units Subcutaneous QHS  . nitroGLYCERIN      . pantoprazole  20 mg Oral q morning - 10a  . pneumococcal 23 valent vaccine  0.5 mL Intramuscular Tomorrow-1000  . pravastatin  40 mg Oral QHS  . ranolazine  1,000 mg Oral Q12H  . regadenoson      . sodium chloride flush  3 mL Intravenous Q12H   Continuous Infusions:  PRN Meds:.hydrALAZINE, morphine injection, nitroGLYCERIN, ondansetron **OR** ondansetron (ZOFRAN) IV Assessment/Plan: Principal Problem:   Hypertensive urgency Active Problems:   Type 2 diabetes mellitus, uncontrolled (HCC)   History of pulmonary embolism  2013   History of MI (myocardial infarction)   Chest pain with moderate risk of acute coronary syndrome   CAD - minor CAD at cath Jan 2013   Essential hypertension   Cephalalgia   Abnormal EKG-inferior TWI   Hypertensive Urgency: Patient BP elevated but better than yesterday. Overnight systolic BP was 149-170's  with symptoms of left sided chest pain, occipital headache, and numbness/tingling down left arm that is still present. We are holding his nitro drip to prevent further exacerbation of his headache and continue to monitor for possible conversion to HTN emergency if there is evidence/symptoms of end organ damage. With his SCr trending downward and no changes in the symptoms he is having I have a low clinical suspicion for HTN emergency at this time.  -Stress myoview completed awaiting cardiology recs at this time. Stress myoview findings include: Downsloping ST segment depression ST segment depression was noted during stress in the III, aVF, V6 and V5 leads.  -F/u Renal U/S for concern for renal artery stenosis. -Has received hydral and clondine 0.1mg  PRN for elevated systolic BP>180's  restart amlodipine and/or metoprolol scheduled now that stress test is completed. If BP is not controlled consider increasing dose before adding another agent. -Troponins have been negative so far at < 0.03 and repeat troponin today also negative. UDS, and TSH within normal limits.   T2DM: Prescribed Metformin 1000 mg, Lantus 35u qhs, and Humulin sliding scale. Maintain CBG under 200..  -Continue SSI while in hospital.   Hx of MI: Reports MI 12-14 years ago. Had a cath, but unsure about why stenting was done, he states having a cath 3 years ago and another one 1 year ago. We have no records on file, appreciate cardiology following up for recs and obtaining records from OSH. -Ranexa 1000 mg q12h  -Pravastatin 20 mg qd -ASA 81 mg   Diet: Heart healthy, can resume eating now that stress test is  completed.  DVT ppx: Lovenox  Code: FULL   Dispo: Disposition is deferred at this time, awaiting improvement of current medical problems. Anticipated discharge in approximately 1-2 day(s).   The patient does not have a current PCP (No Pcp Per Patient) and does not need [facility] follow-up appointment after discharge.  The patient does have transportation limitations that hinder transportation to clinic appointments  This is a Psychologist, occupationalMedical Student Note.  The care of the patient was discussed with Dr. Tasia CatchingsAhmed  and the assessment and plan formulated with their assistance.  Please see their attached note for official documentation of the daily encounter.     Kandyce RudStephen Frempong Jr., Med Student 11/10/2015, 10:53 AM

## 2015-11-10 NOTE — Progress Notes (Signed)
Subjective:  Has occipital headache and continues to have Left chest pain radiating to his arm, not to his leg anymore. CP is better (7/10 today, was 10/10 yesterday). Having some nausea. No events overnight. BP is better. Had his stress test. BP was elevated over 200 during the test but now 170s/130's at his regular room. Still having some dizziness without vertigo.   Objective: Vital signs in last 24 hours: Filed Vitals:   11/09/15 1710 11/09/15 2004 11/10/15 0514 11/10/15 0755  BP: 140/87 172/96 172/117   Pulse: 65 78 78   Temp:  98 F (36.7 C) 98.8 F (37.1 C)   TempSrc:  Oral Oral   Resp: 13 14 16 15   Height:      Weight:   250 lb 14.4 oz (113.807 kg)   SpO2: 94% 95% 97%    Weight change: 301 lb 10.6 oz (136.833 kg)  Intake/Output Summary (Last 24 hours) at 11/10/15 0834 Last data filed at 11/09/15 1939  Gross per 24 hour  Intake    240 ml  Output   1700 ml  Net  -1460 ml   General: sitting up in bed in NAD, cooperative with exam HEENT: [place]/AT, neck supple Cardiac: RRR, + 2/6 systolic murmur, no rubs or gallops; no carotid bruits, peripheral pulses 2+ B/L Pulm: clear to auscultation bilaterally, moving normal volumes of air Abd: soft, nontender, nondistended, BS present Ext: warm and well perfused, no pedal edema Neuro: alert and oriented X3, CN 2-12 except unequal facial sensation (left feels lighter than right) and left arm.  Lab Results: Basic Metabolic Panel:  Recent Labs Lab 11/09/15 0800 11/10/15 0753  NA 133* 136  K 4.0 3.5  CL 99* 103  CO2 25 26  GLUCOSE 430* 162*  BUN 7 8  CREATININE 1.21 0.97  CALCIUM 8.9 8.9   Cardiac Enzymes:  Recent Labs Lab 11/08/15 2238 11/09/15 0409 11/09/15 0947  TROPONINI <0.03 <0.03 <0.03   CBG:  Recent Labs Lab 11/09/15 0743 11/09/15 0852 11/09/15 1327  GLUCAP 448* 474* 423*    Medications: I have reviewed the patient's current medications. Scheduled Meds: . amLODipine  10 mg Oral q morning - 10a  .  aspirin  81 mg Oral q morning - 10a  . cloNIDine  0.1 mg Oral Daily  . enoxaparin (LOVENOX) injection  40 mg Subcutaneous Q24H  . gabapentin  300 mg Oral TID  . hydrALAZINE  50 mg Oral Q8H  . insulin aspart  0-15 Units Subcutaneous TID WC  . insulin aspart  0-5 Units Subcutaneous QHS  . insulin glargine  30 Units Subcutaneous QHS  . pantoprazole  20 mg Oral q morning - 10a  . pneumococcal 23 valent vaccine  0.5 mL Intramuscular Tomorrow-1000  . pravastatin  40 mg Oral QHS  . ranolazine  1,000 mg Oral Q12H  . sodium chloride flush  3 mL Intravenous Q12H   Continuous Infusions: NTG gtt PRN Meds:.morphine injection, nitroGLYCERIN, ondansetron **OR** ondansetron (ZOFRAN) IV Assessment/Plan:  Hypertensive urgency:  No sign of end organ damage. Has headache without any other acute neuro changes. CT head normal. No trop bump or AKI.  unclear precipitant.  He is an inmate and should be getting all prescribed meds.  UDS negative.  - cont amlodipine + clonidine + hydral. Cont metop today. If BP still elevated later, may start the lisinopril and chlorthalidone.   Chest pain w/ CAD hx: Trop have been negative x 3.  Repeat EKG with abnormal inferior Twaves unchanged from  admission, no ST elevation.   Hx of PE but no unilateral leg edema, tachy or CP. Ddimer negative - appreciate card recs: stress myoview today. Results pending.  - continue ASA, statin, Ranexa, prn SL NTG  HA and dizziness - could be from hypertensive urgency. He did have a head injury 1 year ago after falling from a tree. Has been dizzy from that. But feels that BP being elevated worsened his dizziness.   Type 2 diabetes mellitus: 11.2 On metformin, Lantus and humulin as outpatient.  CBG elevated this AM without usual long acting insulin overnight. - Lantus 30 units qHS, titrate up as needed - continue SSI-S - hold metformin while inpatient, may need contrast procedure  Diet:  Carb mod VTE ppx:  [place] Lovenox, SCDs Code status:   Full  Dispo: Disposition is deferred at this time, awaiting improvement of current medical problems.  Anticipated discharge in approximately 1-2 day(s).   The patient does not have a current PCP (inmate) and does not need an [facility] hospital follow-up appointment after discharge.  The patient does not have transportation limitations that hinder transportation to clinic appointments.  .Services Needed at time of discharge: Y = Yes, Blank = No PT:   OT:   RN:   Equipment:   Other:       Hyacinth Meeker, MD 11/10/2015, 8:34 AM

## 2015-11-11 ENCOUNTER — Observation Stay (HOSPITAL_COMMUNITY)

## 2015-11-11 DIAGNOSIS — E1165 Type 2 diabetes mellitus with hyperglycemia: Secondary | ICD-10-CM | POA: Diagnosis present

## 2015-11-11 DIAGNOSIS — Z23 Encounter for immunization: Secondary | ICD-10-CM | POA: Diagnosis not present

## 2015-11-11 DIAGNOSIS — I2510 Atherosclerotic heart disease of native coronary artery without angina pectoris: Secondary | ICD-10-CM | POA: Diagnosis present

## 2015-11-11 DIAGNOSIS — Z794 Long term (current) use of insulin: Secondary | ICD-10-CM | POA: Diagnosis not present

## 2015-11-11 DIAGNOSIS — R42 Dizziness and giddiness: Secondary | ICD-10-CM | POA: Diagnosis not present

## 2015-11-11 DIAGNOSIS — Z8249 Family history of ischemic heart disease and other diseases of the circulatory system: Secondary | ICD-10-CM | POA: Diagnosis not present

## 2015-11-11 DIAGNOSIS — R9431 Abnormal electrocardiogram [ECG] [EKG]: Secondary | ICD-10-CM

## 2015-11-11 DIAGNOSIS — R079 Chest pain, unspecified: Secondary | ICD-10-CM | POA: Diagnosis not present

## 2015-11-11 DIAGNOSIS — I10 Essential (primary) hypertension: Secondary | ICD-10-CM | POA: Diagnosis not present

## 2015-11-11 DIAGNOSIS — I160 Hypertensive urgency: Secondary | ICD-10-CM | POA: Diagnosis present

## 2015-11-11 DIAGNOSIS — I119 Hypertensive heart disease without heart failure: Secondary | ICD-10-CM | POA: Diagnosis present

## 2015-11-11 DIAGNOSIS — I25119 Atherosclerotic heart disease of native coronary artery with unspecified angina pectoris: Secondary | ICD-10-CM | POA: Diagnosis not present

## 2015-11-11 DIAGNOSIS — Z7982 Long term (current) use of aspirin: Secondary | ICD-10-CM | POA: Diagnosis not present

## 2015-11-11 DIAGNOSIS — R51 Headache: Secondary | ICD-10-CM | POA: Diagnosis not present

## 2015-11-11 DIAGNOSIS — Z87891 Personal history of nicotine dependence: Secondary | ICD-10-CM | POA: Diagnosis not present

## 2015-11-11 DIAGNOSIS — Z86711 Personal history of pulmonary embolism: Secondary | ICD-10-CM | POA: Diagnosis not present

## 2015-11-11 DIAGNOSIS — I252 Old myocardial infarction: Secondary | ICD-10-CM | POA: Diagnosis not present

## 2015-11-11 LAB — BASIC METABOLIC PANEL
Anion gap: 10 (ref 5–15)
BUN: 7 mg/dL (ref 6–20)
CALCIUM: 9.7 mg/dL (ref 8.9–10.3)
CO2: 24 mmol/L (ref 22–32)
CREATININE: 1.12 mg/dL (ref 0.61–1.24)
Chloride: 99 mmol/L — ABNORMAL LOW (ref 101–111)
GFR calc non Af Amer: >60 mL/min (ref >60)
Glucose, Bld: 268 mg/dL — ABNORMAL HIGH (ref 65–99)
Potassium: 3.6 mmol/L (ref 3.5–5.1)
Sodium: 133 mmol/L — ABNORMAL LOW (ref 135–145)

## 2015-11-11 LAB — GLUCOSE, CAPILLARY
GLUCOSE-CAPILLARY: 193 mg/dL — AB (ref 65–99)
GLUCOSE-CAPILLARY: 120 mg/dL — AB (ref 65–99)
Glucose-Capillary: 209 mg/dL — ABNORMAL HIGH (ref 65–99)
Glucose-Capillary: 333 mg/dL — ABNORMAL HIGH (ref 65–99)

## 2015-11-11 NOTE — Progress Notes (Signed)
*  PRELIMINARY RESULTS* Vascular Ultrasound Renal Artery Duplex has been completed.  Preliminary findings: No evidence of renal artery stenosis.   Farrel DemarkJill Eunice, RDMS, RVT  11/11/2015, 10:02 AM

## 2015-11-11 NOTE — Progress Notes (Signed)
Subjective:  Continues to have occipital headache, improving slightly. Also has 7/10 left sided chest pain which is improving from yesterday. BP is better controlled today 150/100. Stress test was intermediate per cards.   Objective: Vital signs in last 24 hours: Filed Vitals:   11/11/15 0430 11/11/15 0500 11/11/15 0630 11/11/15 0740  BP:    155/108  Pulse:    100  Temp:    98.5 F (36.9 C)  TempSrc:    Oral  Resp: Height:      Weight:   248 lb (112.492 kg)   SpO2:    95%   Weight change: -308 lb 10.6 oz (-140.008 kg)  Intake/Output Summary (Last 24 hours) at 11/11/15 1148 Last data filed at 11/11/15 0423  Gross per 24 hour  Intake   1203 ml  Output   1550 ml  Net   -347 ml   General: sitting up in bed in NAD, cooperative with exam HEENT: [place]/AT, neck supple Cardiac: RRR, + 2/6 systolic murmur, no rubs or gallops; no carotid bruits, peripheral pulses 2+ B/L Pulm: clear to auscultation bilaterally, moving normal volumes of air Abd: soft, nontender, nondistended, BS present Ext: warm and well perfused, no pedal edema. Has handcuff on his left leg.  Neuro: alert and oriented X3, CN 2-12 except unequal facial sensation (left feels lighter than right) and left arm.  Lab Results: Basic Metabolic Panel:  Recent Labs Lab 11/10/15 0753 11/11/15 0345  NA 136 133*  K 3.5 3.6  CL 103 99*  CO2 26 24  GLUCOSE 162* 268*  BUN 8 7  CREATININE 0.97 1.12  CALCIUM 8.9 9.7   Cardiac Enzymes:  Recent Labs Lab 11/08/15 2238 11/09/15 0409 11/09/15 0947  TROPONINI <0.03 <0.03 <0.03   CBG:  Recent Labs Lab 11/10/15 0719 11/10/15 1126 11/10/15 1610 11/10/15 2150 11/11/15 0727 11/11/15 1145  GLUCAP 166* 235* 318* 269* 193* 120*    Medications: I have reviewed the patient's current medications. Scheduled Meds: . amLODipine  10 mg Oral q morning - 10a  . aspirin  81 mg Oral q morning - 10a  . cloNIDine  0.1 mg Oral Daily  . enoxaparin (LOVENOX) injection   40 mg Subcutaneous Q24H  . furosemide  40 mg Oral Daily  . gabapentin  300 mg Oral TID  . hydrALAZINE  50 mg Oral Q8H  . insulin aspart  0-15 Units Subcutaneous TID WC  . insulin aspart  0-5 Units Subcutaneous QHS  . insulin glargine  30 Units Subcutaneous QHS  . metoprolol  50 mg Oral Q12H  . pantoprazole  20 mg Oral q morning - 10a  . pravastatin  40 mg Oral QHS  . sodium chloride flush  3 mL Intravenous Q12H  . spironolactone  25 mg Oral Daily   Continuous Infusions: NTG gtt PRN Meds:.acetaminophen, hydrALAZINE, morphine injection, nitroGLYCERIN, ondansetron **OR** ondansetron (ZOFRAN) IV, oxyCODONE Assessment/Plan:  Hypertensive urgency:  No sign of end organ damage. Has headache without any other acute neuro changes. CT head normal. No trop bump or AKI.  unclear precipitant.  He is an inmate and should be getting all prescribed meds.  UDS negative. Renal artery u/s negative.  - appreciate card recs: started spironolactone, switched thiazide to lasix, increased metoprolol, cont amlodipine and clonidine.   Chest pain w/ CAD hx: Trop have been negative x 3.  Repeat EKG with abnormal inferior Twaves unchanged from admission, no ST elevation.   Hx of PE but no unilateral  leg edema, tachy or CP. Ddimer negative - appreciate card recs: stress myoview intermediate risk. Focus on BP control for now. - continue ASA, statin, Ranexa, prn SL NTG  HA and dizziness - could be from hypertensive urgency. He did have a head injury 1 year ago after falling from a tree. Has been dizzy from that. But feels that BP being elevated worsened his dizziness.   Type 2 diabetes mellitus: 11.2 On metformin, Lantus and humulin as outpatient.  CBG elevated this AM without usual long acting insulin overnight. - Lantus 30 units qHS, titrate up as needed - continue SSI-S - hold metformin while inpatient, may need contrast procedure  Diet:  Carb mod VTE ppx:  [place] Lovenox, SCDs Code status:  Full  Dispo:  Disposition is deferred at this time, awaiting improvement of current medical problems.  Anticipated discharge in approximately 1-2 day(s).   The patient does not have a current PCP (inmate) and [facility] after [facility].  The patient does not have transportation limitations that hinder transportation to clinic appointments.  .Services Needed at time of discharge: Y = Yes, Blank = No PT:   OT:   RN:   Equipment:   Other:       Hyacinth Meekerasrif Jia Mohamed, MD 11/11/2015, 11:48 AM

## 2015-11-11 NOTE — Evaluation (Signed)
Physical Therapy Evaluation/Vestibular Assessment Patient Details Name: Raymond Crane MRN: [id number] DOB: [birth year] Today's Date: 11/11/2015   History of Present Illness  55 y.[facility]. [facility] blurry vision, chest pain, HA, and elevated BP.  Pt dx with HTN urgency.  Cardiology consulted and stress myoview indicated intermediat risk.  They recommended treat HTN for now.  Pt with significant PMHx  of HTN, PE, MI, and DM.  Clinical Impression  Pt with symptoms throughout vestibular testing, but no signs that this is a central or peripheral vestibular issue.  He walks a bit slow, but is generally steady on his feet.  He has no acute or f/u therapy needs at this time.  PT to sign off.     Follow Up Recommendations No PT follow up    Equipment Recommendations  None recommended by PT    Recommendations for Other Services   NA    Precautions / Restrictions Precautions Precautions: Other (comment) Precaution Comments: monitor BPs      Mobility  Bed Mobility Overal bed mobility: Independent                Transfers Overall transfer level: Independent                  Ambulation/Gait Ambulation/Gait assistance: Independent Ambulation Distance (Feet): 60 Feet Assistive device: None Gait Pattern/deviations: WFL(Within Functional Limits)   Gait velocity interpretation: Below normal speed for age/gender General Gait Details: slower than usual, but that was compensation for HA and mild dizziness.          Balance Overall balance assessment: No apparent balance deficits (not formally assessed)                                           Pertinent Vitals/Pain Pain Assessment: 0-10 Pain Score: 8  Pain Location: head Pain Descriptors / Indicators: Aching Pain Intervention(s): Limited activity within patient's tolerance;Monitored during session;Repositioned    Home Living Family/patient expects to be discharged to::  Dentention/Prison                      Prior Function Level of Independence: Independent                  Extremity/Trunk Assessment   Upper Extremity Assessment: Overall WFL for tasks assessed           Lower Extremity Assessment: Overall WFL for tasks assessed      Cervical / Trunk Assessment: Normal  Communication   Communication: No difficulties  Cognition Arousal/Alertness: Awake/alert Behavior During Therapy: WFL for tasks assessed/performed Overall Cognitive Status: Within Functional Limits for tasks assessed                      General Comments General comments (skin integrity, edema, etc.): Vestibular assessment preformed.  Pt symptomatic with all BPPV testing and difficulty with gaze stability testing, however, sypmtoms are throughout testing and nothing indicates a truely vestibular cause to his symptoms. I believe they are related to his critical BP values and not a central or peripheral vestibular issue.           Assessment/Plan    PT Assessment Patent does not need any further PT services  PT Diagnosis Difficulty walking;Abnormality of gait;Generalized weakness;Acute pain         PT Goals (Current goals can be  found in the Care Plan section) Acute Rehab PT Goals PT Goal Formulation: All assessment and education complete, DC therapy     End of Session   Activity Tolerance: Patient tolerated treatment well Patient left: in bed;with call bell/phone within reach;Other (comment);with restraints reapplied (with guard present) Nurse Communication: Mobility status         Time: 8119-1478 PT Time Calculation (min) (ACUTE ONLY): 28 min   Charges:   PT Evaluation $PT Eval Moderate Complexity: 1 Procedure PT Treatments $Therapeutic Activity: 8-22 mins    Raymond Crane, PT, DPT [phone]   11/11/2015, 6:11 PM

## 2015-11-11 NOTE — Progress Notes (Signed)
Noted  BP measurement  disparity between pt Right and Left arm. Pt BP reading on L arm = 197/118; Rt Arm = 151/109; BP taken manually by 2 RN's with different results : Rt arm = 186/102 : Lt arm = 198/100 by Consulting civil engineerCharge RN . This RN got 170/100 on Rt Arm and 140/90 on the Lt Arm. Dr. Tasia CatchingsAhmed initially updated. Paged Teaching Service back for guidance with above results. Dr. Allena KatzPatel called back and updated with instruction initially to use BP machine and use the Left arm which had the higher reading. Dr. Allena KatzPatel called back with instruction to measure pt BP  On both arms and to update him accordingly with results.

## 2015-11-12 DIAGNOSIS — I2510 Atherosclerotic heart disease of native coronary artery without angina pectoris: Secondary | ICD-10-CM

## 2015-11-12 DIAGNOSIS — I160 Hypertensive urgency: Principal | ICD-10-CM

## 2015-11-12 LAB — BASIC METABOLIC PANEL
Anion gap: 11 (ref 5–15)
BUN: 11 mg/dL (ref 6–20)
CALCIUM: 8.6 mg/dL — AB (ref 8.9–10.3)
CO2: 27 mmol/L (ref 22–32)
CREATININE: 1.13 mg/dL (ref 0.61–1.24)
Chloride: 95 mmol/L — ABNORMAL LOW (ref 101–111)
Glucose, Bld: 252 mg/dL — ABNORMAL HIGH (ref 65–99)
Potassium: 3.8 mmol/L (ref 3.5–5.1)
SODIUM: 133 mmol/L — AB (ref 135–145)

## 2015-11-12 LAB — GLUCOSE, CAPILLARY
GLUCOSE-CAPILLARY: 254 mg/dL — AB (ref 65–99)
GLUCOSE-CAPILLARY: 206 mg/dL — AB (ref 65–99)
GLUCOSE-CAPILLARY: 197 mg/dL — AB (ref 65–99)
Glucose-Capillary: 163 mg/dL — ABNORMAL HIGH (ref 65–99)

## 2015-11-12 MED ORDER — SODIUM CHLORIDE 0.9 % IV BOLUS (SEPSIS): 250.0000 mL | Freq: Once | INTRAVENOUS | Status: DC

## 2015-11-12 MED ORDER — SPIRONOLACTONE 25 MG PO TABS: 25.0000 mg | ORAL_TABLET | Freq: Every day | ORAL | Status: DC

## 2015-11-12 MED ORDER — FUROSEMIDE 40 MG PO TABS: 40.0000 mg | ORAL_TABLET | Freq: Every day | ORAL | Status: DC

## 2015-11-12 MED ORDER — INSULIN GLARGINE 100 UNIT/ML ~~LOC~~ SOLN
35.0000 [IU] | Freq: Every day | SUBCUTANEOUS | Status: DC
  Administered 2015-11-12: 35 [IU] via SUBCUTANEOUS
  Filled 2015-11-12 (×2): qty 0.35

## 2015-11-12 NOTE — Progress Notes (Signed)
Subjective:  Continues to have occipital headache and left sided chest pain. 6/10 today. Feeling better overall. BP is improving significantly.   Objective: Vital signs in last 24 hours: Filed Vitals:   11/12/15 0625 11/12/15 0635 11/12/15 0801 11/12/15 0840  BP: 164/98  142/105 154/112  Pulse:   89   Temp:   98.8 F (37.1 C)   TempSrc:   Oral   Resp: Height:      Weight:      SpO2:   95%    Weight change: -1 lb 12.8 oz (-0.816 kg)  Intake/Output Summary (Last 24 hours) at 11/12/15 0841 Last data filed at 11/12/15 0827  Gross per 24 hour  Intake    840 ml  Output      0 ml  Net    840 ml   General: sitting up in bed in NAD, cooperative with exam HEENT: [place]/AT, neck supple Cardiac: RRR, + 2/6 systolic murmur, no rubs or gallops; no carotid bruits, peripheral pulses 2+ B/L Pulm: clear to auscultation bilaterally, moving normal volumes of air Abd: soft, nontender, nondistended, BS present Ext: warm and well perfused, no pedal edema. Has handcuff on his left leg.  Neuro: alert and oriented X3, CN 2-12 except unequal facial sensation (left feels lighter than right) and left arm.  Lab Results: Basic Metabolic Panel:  Recent Labs Lab 11/10/15 0753 11/11/15 0345  NA 136 133*  K 3.5 3.6  CL 103 99*  CO2 26 24  GLUCOSE 162* 268*  BUN 8 7  CREATININE 0.97 1.12  CALCIUM 8.9 9.7   Cardiac Enzymes:  Recent Labs Lab 11/08/15 2238 11/09/15 0409 11/09/15 0947  TROPONINI <0.03 <0.03 <0.03   CBG:  Recent Labs Lab 11/10/15 2150 11/11/15 0727 11/11/15 1145 11/11/15 1617 11/11/15 2106 11/12/15 0758  GLUCAP 269* 193* 120* 209* 333* 254*    Medications: I have reviewed the patient's current medications. Scheduled Meds: . amLODipine  10 mg Oral q morning - 10a  . aspirin  81 mg Oral q morning - 10a  . cloNIDine  0.1 mg Oral Daily  . enoxaparin (LOVENOX) injection  40 mg Subcutaneous Q24H  . furosemide  40 mg Oral Daily  . gabapentin  300 mg Oral  TID  . hydrALAZINE  50 mg Oral Q8H  . insulin aspart  0-15 Units Subcutaneous TID WC  . insulin aspart  0-5 Units Subcutaneous QHS  . insulin glargine  30 Units Subcutaneous QHS  . metoprolol  50 mg Oral Q12H  . pantoprazole  20 mg Oral q morning - 10a  . pravastatin  40 mg Oral QHS  . sodium chloride flush  3 mL Intravenous Q12H  . spironolactone  25 mg Oral Daily   Continuous Infusions: NTG gtt PRN Meds:.acetaminophen, hydrALAZINE, morphine injection, nitroGLYCERIN, ondansetron **OR** ondansetron (ZOFRAN) IV, oxyCODONE Assessment/Plan:  Hypertensive urgency:  No sign of end organ damage. Has headache without any other acute neuro changes. CT head normal. No trop bump or AKI.  unclear precipitant.  He is an inmate and should be getting all prescribed meds.  UDS negative. Renal artery u/s negative.  BP is slightly different on each arm (I checked it manually 132/100 on R, 140/96 on Left). CXR did not show any mediastinal widening, my suspicion is very low for aortic dissection. His CP is improving.   - appreciate card recs: started spironolactone, switched thiazide to lasix, increased metoprolol, continued amlodipine and clonidine.  - discharge today with current  BP meds.   Chest pain w/ CAD hx: improved. Trop have been negative x 3.  Repeat EKG with abnormal inferior Twaves unchanged from admission, no ST elevation.   Hx of PE but no unilateral leg edema, tachy or CP. Ddimer negative - appreciate card recs: stress myoview intermediate risk. Focus on BP control for now. - continue ASA, statin, Ranexa, prn SL NTG  HA and dizziness - could be from hypertensive urgency. He did have a head injury 1 year ago after falling from a tree. Has been dizzy from that. But feels that BP being elevated worsened his dizziness. PT evalauted him for peripheral vestibular issues and did not find any objective findings to support that. Recommended no f/up therapy at this time.   Type 2 diabetes mellitus:  11.2 On metformin, Lantus and humulin as outpatient.  CBG elevated. - increase lantus back to home dose 35 units - continue SSI-S  Diet:  Carb mod VTE ppx:  [place] Lovenox, SCDs Code status:  Full  Dispo: discharge likely today.  The patient does not have a current PCP (inmate) and [facility] after discharge.  The patient does not have transportation limitations that hinder transportation to clinic appointments.  .Services Needed at time of discharge: Y = Yes, Blank = No PT:   OT:   RN:   Equipment:   Other:     LOS: 1 day   Hyacinth Meekerasrif Landra Howze, MD 11/12/2015, 8:41 AM

## 2015-11-12 NOTE — Progress Notes (Signed)
Patient ID: Naif Alabi, male   DOB: 29-Apr-1961, 55 y.o.   MRN: [id number]       Patient Name: Raymond Crane Date of Encounter: 11/12/2015    SUBJECTIVE: No chest pain  Or complaints BP still unconrtolled   TELEMETRY:  Normal sinus rhythm: Filed Vitals:   11/12/15 0839 11/12/15 0840 11/12/15 0841 11/12/15 0842  BP: 154/112 154/112 169/111   Pulse:    94  Temp:      TempSrc:      Resp: 18  14   Height:      Weight:      SpO2:        Intake/Output Summary (Last 24 hours) at 11/12/15 1008 Last data filed at 11/12/15 0827  Gross per 24 hour  Intake    840 ml  Output      0 ml  Net    840 ml   LABS: Basic Metabolic Panel:  Recent Labs  11/91/47 0753 11/11/15 0345  NA 136 133*  K 3.5 3.6  CL 103 99*  CO2 26 24  GLUCOSE 162* 268*  BUN 8 7  CREATININE 0.97 1.12  CALCIUM 8.9 9.7    Radiology/Studies:  No new data  Physical Exam: BP 169/111 mmHg  Pulse 94  Temp(Src) 98.8 F (37.1 C) (Oral)  Resp 14  Ht  (1.854 m)  Wt 111.676 kg (246 lb 3.2 oz)  BMI 32.49 kg/m2  SpO2 95%  Affect appropriate Healthy:  appears stated age HEENT: normal Neck supple with no adenopathy JVP normal no bruits no thyromegaly Lungs clear with no wheezing and good diaphragmatic motion Heart:  S1/S2 no murmur, no rub, gallop or click PMI normal Abdomen: benighn, BS positve, no tenderness, no AAA no bruit.  No HSM or HJR Distal pulses intact with no bruits No edema Neuro non-focal Skin warm and dry No muscular weakness   Current facility-administered medications:  .  acetaminophen (TYLENOL) tablet 650 mg, 650 mg, Oral, Q6H PRN, Hyacinth Meeker, MD, 650 mg at 11/11/15 0143 .  amLODipine (NORVASC) tablet 10 mg, 10 mg, Oral, q morning - 10a, Yolanda Manges, DO, 10 mg at 11/12/15 0840 .  aspirin chewable tablet 81 mg, 81 mg, Oral, q morning - 10a, Su Hoff, MD, 81 mg at 11/12/15 0841 .  cloNIDine (CATAPRES) tablet 0.1 mg, 0.1 mg, Oral, Daily, Carly J Rivet, MD, 0.1 mg at  11/11/15 1800 .  enoxaparin (LOVENOX) injection 40 mg, 40 mg, Subcutaneous, Q24H, Carly J Rivet, MD, 40 mg at 11/11/15 1800 .  furosemide (LASIX) tablet 40 mg, 40 mg, Oral, Daily, Lyn Records, MD, 40 mg at 11/12/15 0841 .  gabapentin (NEURONTIN) capsule 300 mg, 300 mg, Oral, TID, Su Hoff, MD, 300 mg at 11/12/15 0841 .  hydrALAZINE (APRESOLINE) injection 10 mg, 10 mg, Intravenous, Q6H PRN, Abelino Derrick, PA-C, 10 mg at 11/11/15 1533 .  hydrALAZINE (APRESOLINE) tablet 50 mg, 50 mg, Oral, Q8H, Su Hoff, MD, 50 mg at 11/12/15 0618 .  insulin aspart (novoLOG) injection 0-15 Units, 0-15 Units, Subcutaneous, TID WC, Su Hoff, MD, 8 Units at 11/12/15 0844 .  insulin aspart (novoLOG) injection 0-5 Units, 0-5 Units, Subcutaneous, QHS, Su Hoff, MD, 4 Units at 11/11/15 2217 .  insulin glargine (LANTUS) injection 35 Units, 35 Units, Subcutaneous, QHS, Tasrif Ahmed, MD .  metoprolol (LOPRESSOR) tablet 50 mg, 50 mg, Oral, Q12H, Lyn Records, MD, 50 mg at 11/12/15 0842 .  morphine 2 MG/ML injection  1 mg, 1 mg, Intravenous, Q4H PRN, Yolanda MangesAlex M Wilson, DO, 1 mg at 11/11/15 0556 .  nitroGLYCERIN (NITROSTAT) SL tablet 0.4 mg, 0.4 mg, Sublingual, Q5 Min x 3 PRN, Luke K Kilroy, PA-C .  ondansetron (ZOFRAN) tablet 4 mg, 4 mg, Oral, Q6H PRN **OR** ondansetron (ZOFRAN) injection 4 mg, 4 mg, Intravenous, Q6H PRN, Carly J Rivet, MD .  oxyCODONE (Oxy IR/ROXICODONE) immediate release tablet 5 mg, 5 mg, Oral, Q3H PRN, Hyacinth Meekerasrif Ahmed, MD, 5 mg at 11/12/15 0636 .  pantoprazole (PROTONIX) EC tablet 20 mg, 20 mg, Oral, q morning - 10a, Carly J Rivet, MD, 20 mg at 11/12/15 0843 .  pravastatin (PRAVACHOL) tablet 40 mg, 40 mg, Oral, QHS, Su Hoffarly J Rivet, MD, 40 mg at 11/11/15 2217 .  sodium chloride flush (NS) 0.9 % injection 3 mL, 3 mL, Intravenous, Q12H, Carly J Rivet, MD, 3 mL at 11/12/15 0845 .  spironolactone (ALDACTONE) tablet 25 mg, 25 mg, Oral, Daily, Lyn RecordsHenry W Smith, MD, 25 mg at 11/12/15  [id number]  ASSESSMENT: HTN:  See notes Dr Katrinka BlazingSmith aldactone started yesterday  On calcium blocker, clonidine diuretic and hydralazine and beta blocker will give current Regimen 48 hrs to see if adequate  Chest Pain:  EF 53% myovue abnormal ? Diaphragmatic attenuation and small area of apical ischemia Plan per Dr Katrinka BlazingSmith is d/c when BP better Controlled and arrange outpatient cath    SignedCharlton Haws, Shallyn Constancio 11/12/2015, 10:08 AM

## 2015-11-12 NOTE — Discharge Summary (Signed)
Name: Raymond Crane MRN: [id number] DOB: 1961/01/29 55 y.o. PCP: No Pcp Per Patient  Date of Admission: 11/08/2015  5:03 PM Date of Discharge: 11/13/2015 Attending Physician: Inez Catalina, MD  Discharge Diagnosis:  Principal Problem:   Hypertensive urgency Active Problems:   Type 2 diabetes mellitus, uncontrolled (HCC)   History of pulmonary embolism 2013   History of MI (myocardial infarction)   Chest pain with moderate risk of acute coronary syndrome   CAD - minor CAD at cath Jan 2013   Essential hypertension   Cephalalgia   Abnormal EKG-inferior TWI  Discharge Medications:   Medication List    STOP taking these medications        chlorthalidone 25 MG tablet  Commonly known as:  HYGROTON      TAKE these medications        amLODipine 10 MG tablet  Commonly known as:  NORVASC  Take 10 mg by mouth every morning.     aspirin 81 MG chewable tablet  Chew 81 mg by mouth every morning.     cloNIDine 0.1 MG tablet  Commonly known as:  CATAPRES  Take 0.1 mg by mouth every 6 (six) hours. Hold if systolic blood pressure <160 and dystolic blood pressure <90     furosemide 40 MG tablet  Commonly known as:  LASIX  Take 1 tablet (40 mg total) by mouth daily.     gabapentin 300 MG capsule  Commonly known as:  NEURONTIN  Take 300 mg by mouth 3 (three) times daily.     HUMULIN R 100 units/mL injection  Generic drug:  insulin regular  Inject 2-12 Units into the skin 3 (three) times daily before meals. Per sliding scale: 200-249 = 2 units; 250-299 = 4 units; 300-349 = 6 units; 350-399 = 8 units; 400-499 = 10 units; 500-549 = 12 units; call provider if >550     hydrALAZINE 50 MG tablet  Commonly known as:  APRESOLINE  Take 50 mg by mouth every 8 (eight) hours.     isosorbide mononitrate 30 MG 24 hr tablet  Commonly known as:  IMDUR  Take 1 tablet (30 mg total) by mouth daily.     LANTUS 100 UNIT/ML injection  Generic drug:  insulin glargine  Inject 35 Units into the  skin at bedtime.     lisinopril 40 MG tablet  Commonly known as:  PRINIVIL,ZESTRIL  Take 40 mg by mouth every morning.     metFORMIN 1000 MG tablet  Commonly known as:  GLUCOPHAGE  Take 1,000 mg by mouth 2 (two) times daily with a meal.     metoprolol 100 MG tablet  Commonly known as:  LOPRESSOR  Take 100 mg by mouth every 12 (twelve) hours.     pantoprazole 20 MG tablet  Commonly known as:  PROTONIX  Take 20 mg by mouth every morning.     pravastatin 40 MG tablet  Commonly known as:  PRAVACHOL  Take 40 mg by mouth at bedtime.     ranolazine 500 MG 12 hr tablet  Commonly known as:  RANEXA  Take 1,000 mg by mouth every 12 (twelve) hours.     spironolactone 25 MG tablet  Commonly known as:  ALDACTONE  Take 1 tablet (25 mg total) by mouth daily.        Disposition and follow-up:   Raymond Crane was discharged from [facility] in Stable condition.  At the hospital follow up visit please address:  1.  Please titrate his BP meds as appropriate. Consider getting BMET in 1 week at follow up.  CP is improving with better BP control, currently 5/10. If he has worsening CP, he will need to be evaluated by cardiology for possible CATH.  Dr. Michaelle Copas (cardiology) office will call him for appt to follow up with thiem   Continue to assess his dizziness. May need further workup outpatient.  2.  Labs / imaging needed at time of follow-up: BMET  3.  Pending labs/ test needing follow-up:   Follow-up Appointments: Follow-up Information    Follow up with Lesleigh Noe, MD.   Specialty:  Cardiology   Why:  The office will call you to arrange Cardiology follow-up. If you do not hear from them in the next 3 business days, please call the number provided.   Contact information:   [address] [place] [address] [phone]       Discharge Instructions:   Consultations: Treatment Team:  Rounding Lbcardiology, MD  Procedures Performed:   Dg Chest 2 View  11/08/2015  CLINICAL DATA:  Chest pain EXAM: CHEST  2 VIEW COMPARISON:  None. FINDINGS: Normal heart size. Normal mediastinal contour. No pneumothorax. No pleural effusion. Lungs appear clear, with no acute consolidative airspace disease and no pulmonary edema. IMPRESSION: No active cardiopulmonary disease. Electronically Signed   By: Delbert Phenix M.D.   On: 11/08/2015 18:57   Ct Head Wo Contrast  11/08/2015  CLINICAL DATA:  Headache, hypertension, dizziness. Symptom onset this afternoon. EXAM: CT HEAD WITHOUT CONTRAST TECHNIQUE: Contiguous axial images were obtained from the base of the skull through the vertex without intravenous contrast. COMPARISON:  None. FINDINGS: Brain: No evidence of acute infarction, hemorrhage, extra-axial collection, ventriculomegaly, or mass effect. Vascular: No hyperdense vessel or unexpected calcification. Skull: Negative for fracture or focal lesion. Sinuses/Orbits: No acute findings. Other: Suspect small subgaleal hematoma about the left posterior parietal scalp, age-indeterminate. IMPRESSION: No acute intracranial abnormality. Electronically Signed   By: Rubye Oaks M.D.   On: 11/08/2015 21:17   Nm Myocar Multi W/spect W/wall Motion / Ef  11/10/2015  CLINICAL DATA:  55 year old with chest pain. EXAM: MYOCARDIAL IMAGING WITH SPECT (REST AND PHARMACOLOGIC-STRESS) GATED LEFT VENTRICULAR WALL MOTION STUDY LEFT VENTRICULAR EJECTION FRACTION TECHNIQUE: Standard myocardial SPECT imaging was performed after resting intravenous injection of 10 mCi Tc-38m tetrofosmin. Subsequently, intravenous infusion of Lexiscan was performed under the supervision of the Cardiology staff. At peak effect of the drug, 30 mCi Tc-30m tetrofosmin was injected intravenously and standard myocardial SPECT imaging was performed. Quantitative gated imaging was also performed to evaluate left ventricular wall motion, and estimate left ventricular ejection fraction. COMPARISON:  None.  FINDINGS: Perfusion: Concern for reversibility along the anterior apex. Study has some technical limitations due to differences in left ventricle orientation between the rest and stress images. There is decreased uptake along the inferior wall on both the rest and stress images which may represent diaphragm attenuation. Wall Motion: Normal left ventricular wall motion. Mild left ventricle dilatation. Left Ventricular Ejection Fraction: 53 % End diastolic volume 157 ml End systolic volume 74 ml IMPRESSION: 1. There is concern for focal reversibility along the anterior apex. Ischemia cannot be excluded at this location. 2. Decreased uptake along the inferior wall on both the rest and stress images with normal wall motion. This may represent diaphragmatic attenuation. 3.  Normal wall motion with calculated ejection fraction of 53%. 4. Non invasive risk stratification*: Intermediate *2012 Appropriate Use Criteria for  Coronary Revascularization Focused Update: J Am Coll Cardiol. 2012;59(9):857-881. http://content.dementiazones.comonlinejacc.org/article.aspx?articleid=[email or web address] Electronically Signed   By: Richarda OverlieAdam  Henn M.D.   On: 11/10/2015 15:36   Renal Artery Duplex has been completed. Preliminary findings: No evidence of renal artery stenosis.  Admission HPI:   Raymond Crane is a 55 year old male with PMH of HTN, T2DM, MI, and PE who presents with hypertension, chest pain, and headache. [facility]. Patient states that his pain began 2-3 days ago. He describes a pounding left sided chest pain with radiation down his left arm and up his neck to the back of his head. His pain has been constant and associated with subjective fever, diaphoresis, lightheadedness, blurry vision, occipital headache, and generalized weakness. He reports numbness and tingling in his left arm. He denies any loss of consciousness, confusion, nausea, vomiting, focal weakness, or back pain. He says this pain occurs when his  blood pressures get high. He believes his normal systolic blood pressures range in the 140s - 150s. He takes Amlodipine, Chlorthalidone, Clonidine, Hydralazine, Lisinopril, and Metoprolol. He says he has not missed any doses as they administer and count pills at his institution.  Per nursing note, EMS were called and administered ASA 324 mg and nitro x 5 without relief. His BP was 230/130. He was started on a nitroglycerin infusion in the ED. CXR was without active cardiopulmonary disease. A Head CT was obtained and was negative for acute hemorrhage or infarct. A point of care troponin was negative. Patient reports some improvement of symptoms since nitro drip was started.   Hospital Course by problem list: Principal Problem:   Hypertensive urgency Active Problems:   Type 2 diabetes mellitus, uncontrolled (HCC)   History of pulmonary embolism 2013   History of MI (myocardial infarction)   Chest pain with moderate risk of acute coronary syndrome   CAD - minor CAD at cath Jan 2013   Essential hypertension   Cephalalgia   Abnormal EKG-inferior TWI   Hypertensive urgency: No sign of end organ damage. Has headache without any other acute neuro changes. CT head normal. No trop bump or AKI. unclear precipitant. He is an inmate and should be getting all prescribed meds. UDS negative. Renal artery u/s negative.  BP is slightly different on each arm (I checked it manually 132/100 on R, 140/96 on Left). CXR did not show any mediastinal widening, my suspicion is very low for aortic dissection. His CP is improving.   - appreciate card recs: started spironolactone, switched thiazide to lasix, increased metoprolol, continued amlodipine and clonidine, and also added imdur. - discharge today with current BP meds.   Chest pain w/ CAD hx: improved. Trop have been negative x 3. Repeat EKG with abnormal inferior Twaves unchanged from admission, no ST elevation.  Hx of PE but no unilateral leg edema, tachy  or CP. Ddimer negative - appreciate card recs: stress myoview was intermediate risk. Recommended controlling his BP for now. If he remains symptomatic from chest pain in the future, they may consider doing cardiac CATH as outpatient. - continue ASA, statin, Ranexa - follow up cardiology outpatient.  HA and dizziness - could be from hypertensive urgency. He did have a head injury 1 year ago after falling from a tree. Has been dizzy from that. But feels that BP being elevated worsened his dizziness. PT evalauted him for peripheral vestibular issues and did not find any objective findings to support that. Recommended no f/up therapy at this time.  Type 2 diabetes mellitus: 11.2 On metformin, Lantus and humulin as outpatient. CBG elevated. - initially was on reduced lantus, increased back to home dose 35 units  Discharge Vitals:   BP 151/102 mmHg  Pulse 80  Temp(Src) 98 F (36.7 C) (Oral)  Resp 13  Ht  (1.854 m)  Wt 246 lb 1.6 oz (111.63 kg)  BMI 32.48 kg/m2  SpO2 96%  Discharge Labs:  Results for orders placed or performed during the hospital encounter of 11/08/15 (from the past 24 hour(s))  Glucose, capillary     Status: Abnormal   Collection Time: 11/12/15  4:28 PM  Result Value Ref Range   Glucose-Capillary 163 (H) 65 - 99 mg/dL  Glucose, capillary     Status: Abnormal   Collection Time: 11/12/15  9:50 PM  Result Value Ref Range   Glucose-Capillary 197 (H) 65 - 99 mg/dL  Basic metabolic panel     Status: Abnormal   Collection Time: 11/13/15  3:34 AM  Result Value Ref Range   Sodium 134 (L) 135 - 145 mmol/L   Potassium 4.0 3.5 - 5.1 mmol/L   Chloride 98 (L) 101 - 111 mmol/L   CO2 28 22 - 32 mmol/L   Glucose, Bld 226 (H) 65 - 99 mg/dL   BUN 11 6 - 20 mg/dL   Creatinine, Ser 1.19 0.61 - 1.24 mg/dL   Calcium 9.1 8.9 - 14.7 mg/dL   GFR calc non Af Amer >60 >60 mL/min   GFR calc Af Amer >60 >60 mL/min   Anion gap 8 5 - 15  Glucose, capillary     Status: Abnormal    Collection Time: 11/13/15  7:24 AM  Result Value Ref Range   Glucose-Capillary 143 (H) 65 - 99 mg/dL  Glucose, capillary     Status: Abnormal   Collection Time: 11/13/15 11:15 AM  Result Value Ref Range   Glucose-Capillary 166 (H) 65 - 99 mg/dL    Signed: Hyacinth Meeker, MD 11/13/2015, 11:53 AM    Services Ordered on Discharge:  Equipment Ordered on Discharge:

## 2015-11-13 DIAGNOSIS — R079 Chest pain, unspecified: Secondary | ICD-10-CM

## 2015-11-13 LAB — BASIC METABOLIC PANEL
Anion gap: 8 (ref 5–15)
BUN: 11 mg/dL (ref 6–20)
CALCIUM: 9.1 mg/dL (ref 8.9–10.3)
CHLORIDE: 98 mmol/L — AB (ref 101–111)
CO2: 28 mmol/L (ref 22–32)
CREATININE: 1.06 mg/dL (ref 0.61–1.24)
GFR calc non Af Amer: >60 mL/min (ref >60)
Glucose, Bld: 226 mg/dL — ABNORMAL HIGH (ref 65–99)
Potassium: 4 mmol/L (ref 3.5–5.1)
SODIUM: 134 mmol/L — AB (ref 135–145)

## 2015-11-13 LAB — GLUCOSE, CAPILLARY
GLUCOSE-CAPILLARY: 166 mg/dL — AB (ref 65–99)
Glucose-Capillary: 143 mg/dL — ABNORMAL HIGH (ref 65–99)

## 2015-11-13 MED ORDER — FUROSEMIDE 40 MG PO TABS: 40.0000 mg | ORAL_TABLET | Freq: Every day | ORAL | Status: DC

## 2015-11-13 MED ORDER — SPIRONOLACTONE 25 MG PO TABS: 25.0000 mg | ORAL_TABLET | Freq: Every day | ORAL | Status: DC

## 2015-11-13 MED ORDER — ISOSORBIDE MONONITRATE ER 30 MG PO TB24
30.0000 mg | ORAL_TABLET | Freq: Every day | ORAL | Status: DC
Start: 2015-11-13 — End: 2016-03-28

## 2015-11-13 MED ORDER — ISOSORBIDE MONONITRATE ER 30 MG PO TB24
30.0000 mg | ORAL_TABLET | Freq: Every day | ORAL | Status: DC
  Administered 2015-11-13: 30 mg via ORAL
  Filled 2015-11-13: qty 1

## 2015-11-13 NOTE — Discharge Instructions (Addendum)
Please take the new Blood pressure medications as instructed.  Follow up with cardiology.   Hypertension Hypertension is another name for high blood pressure. High blood pressure forces your heart to work harder to pump blood. A blood pressure reading has two numbers, which includes a higher number over a lower number (example: 110/72). HOME CARE   Have your blood pressure rechecked by your doctor.  Only take medicine as told by your doctor. Follow the directions carefully. The medicine does not work as well if you skip doses. Skipping doses also puts you at risk for problems.  Do not smoke.  Monitor your blood pressure at home as told by your doctor. GET HELP IF:  You think you are having a reaction to the medicine you are taking.  You have repeat headaches or feel dizzy.  You have puffiness (swelling) in your ankles.  You have trouble with your vision. GET HELP RIGHT AWAY IF:   You get a very bad headache and are confused.  You feel weak, numb, or faint.  You get chest or belly (abdominal) pain.  You throw up (vomit).  You cannot breathe very well. MAKE SURE YOU:   Understand these instructions.  Will watch your condition.  Will get help right away if you are not doing well or get worse.   This information is not intended to replace advice given to you by your health care provider. Make sure you discuss any questions you have with your health care provider.   Document Released: 11/21/2007 Document Revised: 06/09/2013 Document Reviewed: 03/27/2013 Elsevier Interactive Patient Education 2016 Elsevier Inc.  Heart-Healthy Eating Plan Many factors influence your heart health, including eating and exercise habits. Heart (coronary) risk increases with abnormal blood fat (lipid) levels. Heart-healthy meal planning includes limiting unhealthy fats, increasing healthy fats, and making other small dietary changes. This includes maintaining a healthy body weight to help keep  lipid levels within a normal range. WHAT IS MY PLAN?  Your health care provider recommends that you:  Get no more than _________% of the total calories in your daily diet from fat.  Limit your intake of saturated fat to less than _________% of your total calories each day.  Limit the amount of cholesterol in your diet to less than _________ mg per day. WHAT TYPES OF FAT SHOULD I CHOOSE?  Choose healthy fats more often. Choose monounsaturated and polyunsaturated fats, such as olive oil and canola oil, flaxseeds, walnuts, almonds, and seeds.  Eat more omega-3 fats. Good choices include salmon, mackerel, sardines, tuna, flaxseed oil, and ground flaxseeds. Aim to eat fish at least two times each week.  Limit saturated fats. Saturated fats are primarily found in animal products, such as meats, butter, and cream. Plant sources of saturated fats include palm oil, palm kernel oil, and coconut oil.  Avoid foods with partially hydrogenated oils in them. These contain trans fats. Examples of foods that contain trans fats are stick margarine, some tub margarines, cookies, crackers, and other baked goods. WHAT GENERAL GUIDELINES DO I NEED TO FOLLOW?  Check food labels carefully to identify foods with trans fats or high amounts of saturated fat.  Fill one half of your plate with vegetables and green salads. Eat 4-5 servings of vegetables per day. A serving of vegetables equals 1 cup of raw leafy vegetables,  cup of raw or cooked cut-up vegetables, or  cup of vegetable juice.  Fill one fourth of your plate with whole grains. Look for the word "whole" as  the first word in the ingredient list.  Fill one fourth of your plate with lean protein foods.  Eat 4-5 servings of fruit per day. A serving of fruit equals one medium whole fruit,  cup of dried fruit,  cup of fresh, frozen, or canned fruit, or  cup of 100% fruit juice.  Eat more foods that contain soluble fiber. Examples of foods that contain  this type of fiber are apples, broccoli, carrots, beans, peas, and barley. Aim to get 20-30 g of fiber per day.  Eat more home-cooked food and less restaurant, buffet, and fast food.  Limit or avoid alcohol.  Limit foods that are high in starch and sugar.  Avoid fried foods.  Cook foods by using methods other than frying. Baking, boiling, grilling, and broiling are all great options. Other fat-reducing suggestions include:  Removing the skin from poultry.  Removing all visible fats from meats.  Skimming the fat off of stews, soups, and gravies before serving them.  Steaming vegetables in water or broth.  Lose weight if you are overweight. Losing just 5-10% of your initial body weight can help your overall health and prevent diseases such as diabetes and heart disease.  Increase your consumption of nuts, legumes, and seeds to 4-5 servings per week. One serving of dried beans or legumes equals  cup after being cooked, one serving of nuts equals 1 ounces, and one serving of seeds equals  ounce or 1 tablespoon.  You may need to monitor your salt (sodium) intake, especially if you have high blood pressure. Talk with your health care provider or dietitian to get more information about reducing sodium. WHAT FOODS CAN I EAT? Grains Breads, including [place], white, pita, wheat, raisin, rye, oatmeal, and [place]. Tortillas that are neither fried nor made with lard or trans fat. Low-fat rolls, including hotdog and hamburger buns and English muffins. Biscuits. Muffins. Waffles. Pancakes. Light popcorn. Whole-grain cereals. Flatbread. Melba toast. Pretzels. Breadsticks. Rusks. Low-fat snacks and crackers, including oyster, saltine, matzo, graham, animal, and rye. Rice and pasta, including brown rice and those that are made with whole wheat. Vegetables All vegetables. Fruits All fruits, but limit coconut. Meats and Other Protein Sources Lean, well-trimmed beef, veal, pork, and lamb. Chicken and  [place] without skin. All fish and shellfish. Wild duck, rabbit, pheasant, and venison. Egg whites or low-cholesterol egg substitutes. Dried beans, peas, lentils, and tofu.Seeds and most nuts. Dairy Low-fat or nonfat cheeses, including ricotta, string, and mozzarella. Skim or 1% milk that is liquid, powdered, or evaporated. Buttermilk that is made with low-fat milk. Nonfat or low-fat yogurt. Beverages Mineral water. Diet carbonated beverages. Sweets and Desserts Sherbets and fruit ices. Honey, jam, marmalade, jelly, and syrups. Meringues and gelatins. Pure sugar candy, such as hard candy, jelly beans, gumdrops, mints, marshmallows, and small amounts of dark chocolate. [employer]. Eat all sweets and desserts in moderation. Fats and Oils Nonhydrogenated (trans-free) margarines. Vegetable oils, including soybean, sesame, sunflower, olive, peanut, safflower, corn, canola, and cottonseed. Salad dressings or mayonnaise that are made with a vegetable oil. Limit added fats and oils that you use for cooking, baking, salads, and as spreads. Other Cocoa powder. Coffee and tea. All seasonings and condiments. The items listed above may not be a complete list of recommended foods or beverages. Contact your dietitian for more options. WHAT FOODS ARE NOT RECOMMENDED? Grains Breads that are made with saturated or trans fats, oils, or whole milk. Croissants. Butter rolls. Cheese breads. Sweet rolls. Donuts. Buttered popcorn. Chow  mein noodles. High-fat crackers, such as cheese or butter crackers. Meats and Other Protein Sources Fatty meats, such as hotdogs, short ribs, sausage, spareribs, bacon, ribeye roast or steak, and mutton. High-fat deli meats, such as salami and bologna. Caviar. Domestic duck and goose. Organ meats, such as kidney, liver, sweetbreads, brains, gizzard, chitterlings, and heart. Dairy Cream, sour cream, cream cheese, and creamed cottage cheese. Whole milk cheeses, including blue (bleu),  [address], [place], [place], [address], [place], [address], [place], [place], and [place]. Whole or 2% milk that is liquid, evaporated, or condensed. Whole buttermilk. Cream sauce or high-fat cheese sauce. Yogurt that is made from whole milk. Beverages Regular sodas and drinks with added sugar. Sweets and Desserts Frosting. Pudding. Cookies. Cakes other than angel food cake. Candy that has milk chocolate or white chocolate, hydrogenated fat, butter, coconut, or unknown ingredients. Buttered syrups. Full-fat ice cream or ice cream drinks. Fats and Oils Gravy that has suet, meat fat, or shortening. Cocoa butter, hydrogenated oils, palm oil, coconut oil, palm kernel oil. These can often be found in baked products, candy, fried foods, nondairy creamers, and whipped toppings. Solid fats and shortenings, including bacon fat, salt pork, lard, and butter. Nondairy cream substitutes, such as coffee creamers and sour cream substitutes. Salad dressings that are made of unknown oils, cheese, or sour cream. The items listed above may not be a complete list of foods and beverages to avoid. Contact your dietitian for more information.   This information is not intended to replace advice given to you by your health care provider. Make sure you discuss any questions you have with your health care provider.   Document Released: 03/13/2008 Document Revised: 06/25/2014 Document Reviewed: 11/26/2013 Elsevier Interactive Patient Education [employer]

## 2015-11-13 NOTE — Progress Notes (Signed)
Subjective:  Continues to have occipital headache and left sided chest pain. Improved further to 5/10 today. As I suspected, BP is much better when checked manually in 140's, but he  Moves his arm and contracts the muscles when done automatically causing high readings. He continues feels better overall. No other complaints.   Objective: Vital signs in last 24 hours: Filed Vitals:   11/12/15 1809 11/12/15 2006 11/13/15 0004 11/13/15 0640  BP: 164/102 124/82 136/92 132/82  Pulse:  78    Temp:  98.4 F (36.9 C) 98.4 F (36.9 C)   TempSrc:  Oral Oral   Resp:   20   Height:      Weight:      SpO2:  96% 98%    Weight change:   Intake/Output Summary (Last 24 hours) at 11/13/15 0659 Last data filed at 11/13/15 0031  Gross per 24 hour  Intake   1123 ml  Output      0 ml  Net   1123 ml   General: sitting up in bed in NAD, cooperative with exam HEENT: [place]/AT, neck supple Cardiac: RRR, + 2/6 systolic murmur, no rubs or gallops; no carotid bruits, peripheral pulses 2+ B/L Pulm: clear to auscultation bilaterally, moving normal volumes of air Abd: soft, nontender, nondistended, BS present Ext: warm and well perfused, no pedal edema. Has handcuff on his left leg.  Neuro: alert and oriented X3, CN 2-12 except unequal facial sensation (left feels lighter than right) and left arm.  Lab Results: Basic Metabolic Panel:  Recent Labs Lab 11/12/15 0922 11/13/15 0334  NA 133* 134*  K 3.8 4.0  CL 95* 98*  CO2 27 28  GLUCOSE 252* 226*  BUN 11 11  CREATININE 1.13 1.06  CALCIUM 8.6* 9.1   Cardiac Enzymes:  Recent Labs Lab 11/08/15 2238 11/09/15 0409 11/09/15 0947  TROPONINI <0.03 <0.03 <0.03   CBG:  Recent Labs Lab 11/11/15 1617 11/11/15 2106 11/12/15 0758 11/12/15 1118 11/12/15 1628 11/12/15 2150  GLUCAP 209* 333* 254* 206* 163* 197*    Medications: I have reviewed the patient's current medications. Scheduled Meds: . amLODipine  10 mg Oral q morning - 10a  .  aspirin  81 mg Oral q morning - 10a  . cloNIDine  0.1 mg Oral Daily  . enoxaparin (LOVENOX) injection  40 mg Subcutaneous Q24H  . furosemide  40 mg Oral Daily  . gabapentin  300 mg Oral TID  . hydrALAZINE  50 mg Oral Q8H  . insulin aspart  0-15 Units Subcutaneous TID WC  . insulin aspart  0-5 Units Subcutaneous QHS  . insulin glargine  35 Units Subcutaneous QHS  . metoprolol  50 mg Oral Q12H  . pantoprazole  20 mg Oral q morning - 10a  . pravastatin  40 mg Oral QHS  . sodium chloride flush  3 mL Intravenous Q12H  . spironolactone  25 mg Oral Daily   Continuous Infusions: NTG gtt PRN Meds:.acetaminophen, hydrALAZINE, morphine injection, nitroGLYCERIN, ondansetron **OR** ondansetron (ZOFRAN) IV, oxyCODONE Assessment/Plan:  Hypertensive urgency:  No sign of end organ damage. Has headache without any other acute neuro changes. CT head normal. No trop bump or AKI.  unclear precipitant.  He is an inmate and should be getting all prescribed meds.  UDS negative. Renal artery u/s negative.  BP much better, especially when checked manually.  - appreciate card recs: started spironolactone, switched thiazide to lasix, increased metoprolol, continued amlodipine and clonidine.  - discharge today with current BP meds.  Chest pain w/ CAD hx: improved. Trop have been negative x 3.  Repeat EKG with abnormal inferior Twaves unchanged from admission, no ST elevation.   Hx of PE but no unilateral leg edema, tachy or CP. Ddimer negative - appreciate card recs: stress myoview intermediate risk. Focus on BP control for now. - continue ASA, statin, Ranexa, prn SL NTG  HA and dizziness - could be from hypertensive urgency. He did have a head injury 1 year ago after falling from a tree. Has been dizzy from that. But feels that BP being elevated worsened his dizziness. PT evalauted him for peripheral vestibular issues and did not find any objective findings to support that. Recommended no f/up therapy at this  time.   Type 2 diabetes mellitus: 11.2 On metformin, Lantus and humulin as outpatient.   - increased lantus back to home dose 35 units, CBG stable under 200's.  - continue SSI-S  Diet:  Carb mod VTE ppx:  [place] Lovenox, SCDs Code status:  Full  Dispo: discharge likely today.  The patient does not have a current PCP (inmate) and does not need an [facility] hospital follow-up appointment after discharge.  The patient does not have transportation limitations that hinder transportation to clinic appointments.  .Services Needed at time of discharge: Y = Yes, Blank = No PT:   OT:   RN:   Equipment:   Other:     LOS: 2 days   Hyacinth Meeker, MD 11/13/2015, 6:59 AM

## 2015-11-13 NOTE — Progress Notes (Signed)
Patient Name: Raymond Crane Date of Encounter: 11/13/2015  Principal Problem:   Hypertensive urgency Active Problems:   Type 2 diabetes mellitus, uncontrolled (HCC)   History of pulmonary embolism 2013   History of MI (myocardial infarction)   Chest pain with moderate risk of acute coronary syndrome   CAD - minor CAD at cath Jan 2013   Essential hypertension   Cephalalgia   Abnormal EKG-inferior TWI   Length of Stay: 2  SUBJECTIVE  Minimal chest pain, but significantly improved since the admission.   CURRENT MEDS . amLODipine  10 mg Oral q morning - 10a  . aspirin  81 mg Oral q morning - 10a  . cloNIDine  0.1 mg Oral Daily  . enoxaparin (LOVENOX) injection  40 mg Subcutaneous Q24H  . furosemide  40 mg Oral Daily  . gabapentin  300 mg Oral TID  . hydrALAZINE  50 mg Oral Q8H  . insulin aspart  0-15 Units Subcutaneous TID WC  . insulin aspart  0-5 Units Subcutaneous QHS  . insulin glargine  35 Units Subcutaneous QHS  . metoprolol  50 mg Oral Q12H  . pantoprazole  20 mg Oral q morning - 10a  . pravastatin  40 mg Oral QHS  . sodium chloride flush  3 mL Intravenous Q12H  . spironolactone  25 mg Oral Daily   OBJECTIVE  Filed Vitals:   11/13/15 0640 11/13/15 0740 11/13/15 0747 11/13/15 0901  BP: 132/82  151/102   Pulse: 71 66  80  Temp: 98.2 F (36.8 C) 98 F (36.7 C)    TempSrc: Oral Oral    Resp: 13     Height:      Weight: 246 lb 1.6 oz (111.63 kg)     SpO2: 97% 96%      Intake/Output Summary (Last 24 hours) at 11/13/15 1125 Last data filed at 11/13/15 0031  Gross per 24 hour  Intake    703 ml  Output      0 ml  Net    703 ml   Filed Weights   11/11/15 0630 11/12/15 0445 11/13/15 0640  Weight: 248 lb (112.492 kg) 246 lb 3.2 oz (111.676 kg) 246 lb 1.6 oz (111.63 kg)    PHYSICAL EXAM  General: Pleasant, NAD. Neuro: Alert and oriented X 3. Moves all extremities spontaneously. Psych: Normal affect. HEENT:  Normal  Neck: Supple without bruits or  JVD. Lungs:  Resp regular and unlabored, CTA. Heart: RRR no s3, s4, or murmurs. Abdomen: Soft, non-tender, non-distended, BS + x 4.  Extremities: No clubbing, cyanosis or edema. DP/PT/Radials 2+ and equal bilaterally.  Accessory Clinical Findings   Recent Labs  11/12/15 0922 11/13/15 0334  NA 133* 134*  K 3.8 4.0  CL 95* 98*  CO2 27 28  GLUCOSE 252* 226*  BUN 11 11  CREATININE 1.13 1.06  CALCIUM 8.6* 9.1   Radiology/Studies  Dg Chest 2 View  11/08/2015  CLINICAL DATA:  Chest pain EXAM: CHEST  2 VIEW COMPARISON:  None. FINDINGS: Normal heart size. Normal mediastinal contour. No pneumothorax. No pleural effusion. Lungs appear clear, with no acute consolidative airspace disease and no pulmonary edema. IMPRESSION: No active cardiopulmonary disease. Electronically Signed   By: Delbert Phenix M.D.   On: 11/08/2015 18:57   Ct Head Wo Contrast  11/08/2015  CLINICAL DATA:  Headache, hypertension, dizziness. Symptom onset this afternoon. EXAM: CT HEAD WITHOUT CONTRAST TECHNIQUE: Contiguous axial images were obtained from the base of the skull through the vertex  without intravenous contrast. COMPARISON:  None. FINDINGS: Brain: No evidence of acute infarction, hemorrhage, extra-axial collection, ventriculomegaly, or mass effect. Vascular: No hyperdense vessel or unexpected calcification. Skull: Negative for fracture or focal lesion. Sinuses/Orbits: No acute findings. Other: Suspect small subgaleal hematoma about the left posterior parietal scalp, age-indeterminate. IMPRESSION: No acute intracranial abnormality. Electronically Signed   By: Rubye OaksMelanie  Ehinger M.D.   On: 11/08/2015 21:17   Nm Myocar Multi W/spect W/wall Motion / Ef  11/10/2015  CLINICAL DATA:  55 year old with chest pain.  IMPRESSION: 1. There is concern for focal reversibility along the anterior apex. Ischemia cannot be excluded at this location. 2. Decreased uptake along the inferior wall on both the rest and stress images with normal  wall motion. This may represent diaphragmatic attenuation. 3.  Normal wall motion with calculated ejection fraction of 53%. 4. Non invasive risk stratification*: Intermediate *2012 Appropriate Use Criteria for Coronary Revascularization Focused Update: J Am Coll Cardiol. 2012;59(9):857-881. http://content.dementiazones.comonlinejacc.org/article.aspx?articleid=1201161 Electronically Signed   By: Richarda OverlieAdam  Henn M.D.   On: 11/10/2015 15:36     ASSESSMENT AND PLAN  1. Hypertensive heart disease without CHF: Aldactone added, still mild hypertension and minimal chest pain, the patient wishes to be discharged. We will add imdur 30 mg po daily and discharge. We will arrange for an outpatient follow up and if symptomatic at that time, we will arrange for an outpatient cath.  2. Chest pain:  EF 53% myovue abnormal ? Diaphragmatic attenuation and small area of apical ischemia Plan per Dr Katrinka BlazingSmith is d/c when BP better, arrange outpatient cath if pain persists despite BP control.  He can be discharged today, we will arrange for an outpatient follow up.    Signed, Tobias AlexanderKatarina Abriella Filkins MD, [facility] 11/13/2015

## 2015-11-15 ENCOUNTER — Telehealth: Payer: Self-pay | Admitting: Interventional Cardiology

## 2015-11-15 NOTE — Telephone Encounter (Signed)
11/15/15 Spoke w/ Ms Ma RingsBernard- [facility] ([phone]) she will get approval for pt to sched f/u- per AVS to follow up w/ [facility] Ms Adela GlimpseBernard aware to bring insurance information on day of visit (Ok per Bjorn Loserhonda), PLEASE MAKE GLENNA/FRONT DESK AWARE OF APPT DATE/TIME; ah

## 2015-11-21 DIAGNOSIS — R079 Chest pain, unspecified: Secondary | ICD-10-CM | POA: Diagnosis not present

## 2015-11-21 DIAGNOSIS — E119 Type 2 diabetes mellitus without complications: Secondary | ICD-10-CM | POA: Diagnosis not present

## 2015-11-21 DIAGNOSIS — I160 Hypertensive urgency: Secondary | ICD-10-CM | POA: Diagnosis not present

## 2015-11-22 ENCOUNTER — Inpatient Hospital Stay (HOSPITAL_COMMUNITY)
Admission: AD | Admit: 2015-11-22 | Discharge: 2015-11-24 | DRG: 287 | Source: Other Acute Inpatient Hospital | Attending: Cardiology | Admitting: Cardiology

## 2015-11-22 DIAGNOSIS — R079 Chest pain, unspecified: Secondary | ICD-10-CM | POA: Diagnosis present

## 2015-11-22 DIAGNOSIS — I2510 Atherosclerotic heart disease of native coronary artery without angina pectoris: Principal | ICD-10-CM | POA: Diagnosis present

## 2015-11-22 DIAGNOSIS — Z87891 Personal history of nicotine dependence: Secondary | ICD-10-CM | POA: Diagnosis not present

## 2015-11-22 DIAGNOSIS — I119 Hypertensive heart disease without heart failure: Secondary | ICD-10-CM | POA: Diagnosis not present

## 2015-11-22 DIAGNOSIS — K219 Gastro-esophageal reflux disease without esophagitis: Secondary | ICD-10-CM | POA: Diagnosis present

## 2015-11-22 DIAGNOSIS — Z7982 Long term (current) use of aspirin: Secondary | ICD-10-CM | POA: Diagnosis not present

## 2015-11-22 DIAGNOSIS — Z794 Long term (current) use of insulin: Secondary | ICD-10-CM | POA: Diagnosis not present

## 2015-11-22 DIAGNOSIS — E1165 Type 2 diabetes mellitus with hyperglycemia: Secondary | ICD-10-CM | POA: Diagnosis not present

## 2015-11-22 DIAGNOSIS — I200 Unstable angina: Secondary | ICD-10-CM

## 2015-11-22 DIAGNOSIS — Z7984 Long term (current) use of oral hypoglycemic drugs: Secondary | ICD-10-CM | POA: Diagnosis not present

## 2015-11-22 DIAGNOSIS — E785 Hyperlipidemia, unspecified: Secondary | ICD-10-CM | POA: Diagnosis not present

## 2015-11-22 DIAGNOSIS — I252 Old myocardial infarction: Secondary | ICD-10-CM

## 2015-11-22 DIAGNOSIS — I10 Essential (primary) hypertension: Secondary | ICD-10-CM | POA: Diagnosis not present

## 2015-11-22 DIAGNOSIS — E7800 Pure hypercholesterolemia, unspecified: Secondary | ICD-10-CM | POA: Diagnosis present

## 2015-11-22 DIAGNOSIS — Z86711 Personal history of pulmonary embolism: Secondary | ICD-10-CM | POA: Diagnosis not present

## 2015-11-22 DIAGNOSIS — I160 Hypertensive urgency: Secondary | ICD-10-CM | POA: Diagnosis present

## 2015-11-22 DIAGNOSIS — IMO0002 Reserved for concepts with insufficient information to code with codable children: Secondary | ICD-10-CM | POA: Diagnosis present

## 2015-11-22 DIAGNOSIS — E119 Type 2 diabetes mellitus without complications: Secondary | ICD-10-CM | POA: Diagnosis not present

## 2015-11-22 HISTORY — DX: Atherosclerotic heart disease of native coronary artery without angina pectoris: I25.10

## 2015-11-22 LAB — MAGNESIUM: Magnesium: 1.9 mg/dL (ref 1.7–2.4)

## 2015-11-22 LAB — CBC WITH DIFFERENTIAL/PLATELET
BASOS ABS: 0 10*3/uL (ref 0.0–0.1)
Basophils Relative: 0 %
Eosinophils Absolute: 0.1 10*3/uL (ref 0.0–0.7)
Eosinophils Relative: 2 %
HCT: 39 % (ref 39.0–52.0)
Hemoglobin: 12.8 g/dL — ABNORMAL LOW (ref 13.0–17.0)
LYMPHS PCT: 32 %
Lymphs Abs: 1.6 10*3/uL (ref 0.7–4.0)
MCH: 27.4 pg (ref 26.0–34.0)
MCHC: 32.8 g/dL (ref 30.0–36.0)
MCV: 83.5 fL (ref 78.0–100.0)
MONO ABS: 0.3 10*3/uL (ref 0.1–1.0)
MONOS PCT: 5 %
NEUTROS ABS: 3.1 10*3/uL (ref 1.7–7.7)
Neutrophils Relative %: 61 %
PLATELETS: 145 10*3/uL — AB (ref 150–400)
RBC: 4.67 MIL/uL (ref 4.22–5.81)
RDW: 14.2 % (ref 11.5–15.5)
WBC: 5.1 10*3/uL (ref 4.0–10.5)

## 2015-11-22 LAB — COMPREHENSIVE METABOLIC PANEL
ALBUMIN: 3.2 g/dL — AB (ref 3.5–5.0)
ALK PHOS: 68 U/L (ref 38–126)
ALT: 18 U/L (ref 17–63)
AST: 14 U/L — AB (ref 15–41)
Anion gap: 6 (ref 5–15)
BILIRUBIN TOTAL: 0.6 mg/dL (ref 0.3–1.2)
CALCIUM: 8.7 mg/dL — AB (ref 8.9–10.3)
CO2: 26 mmol/L (ref 22–32)
CREATININE: 1 mg/dL (ref 0.61–1.24)
Chloride: 101 mmol/L (ref 101–111)
GFR calc Af Amer: >60 mL/min (ref >60)
GFR calc non Af Amer: >60 mL/min (ref >60)
GLUCOSE: 244 mg/dL — AB (ref 65–99)
Potassium: 3.6 mmol/L (ref 3.5–5.1)
Sodium: 133 mmol/L — ABNORMAL LOW (ref 135–145)
TOTAL PROTEIN: 6.1 g/dL — AB (ref 6.5–8.1)

## 2015-11-22 LAB — GLUCOSE, CAPILLARY
GLUCOSE-CAPILLARY: 234 mg/dL — AB (ref 65–99)
GLUCOSE-CAPILLARY: 323 mg/dL — AB (ref 65–99)

## 2015-11-22 LAB — TROPONIN I: Troponin I: <0.03 ng/mL (ref <0.031)

## 2015-11-22 LAB — PROTIME-INR
INR: 0.97 (ref 0.00–1.49)
PROTHROMBIN TIME: 13.1 s (ref 11.6–15.2)

## 2015-11-22 LAB — TSH: TSH: 0.669 u[IU]/mL (ref 0.350–4.500)

## 2015-11-22 LAB — T4, FREE: Free T4: 0.99 ng/dL (ref 0.61–1.12)

## 2015-11-22 MED ORDER — NITROGLYCERIN 0.4 MG SL SUBL: 0.4000 mg | SUBLINGUAL_TABLET | SUBLINGUAL | Status: DC | PRN

## 2015-11-22 MED ORDER — HYDRALAZINE HCL 50 MG PO TABS
50.0000 mg | ORAL_TABLET | Freq: Three times a day (TID) | ORAL | Status: DC
  Administered 2015-11-22 – 2015-11-24 (×5): 50 mg via ORAL
  Filled 2015-11-22 (×5): qty 1

## 2015-11-22 MED ORDER — FUROSEMIDE 40 MG PO TABS
40.0000 mg | ORAL_TABLET | Freq: Every day | ORAL | Status: DC
  Administered 2015-11-23 – 2015-11-24 (×2): 40 mg via ORAL
  Filled 2015-11-22 (×2): qty 1

## 2015-11-22 MED ORDER — SODIUM CHLORIDE 0.9 % WEIGHT BASED INFUSION
3.0000 mL/kg/h | INTRAVENOUS | Status: DC
  Administered 2015-11-23: 3 mL/kg/h via INTRAVENOUS

## 2015-11-22 MED ORDER — SODIUM CHLORIDE 0.9% FLUSH: 3.0000 mL | INTRAVENOUS | Status: DC | PRN

## 2015-11-22 MED ORDER — AMLODIPINE BESYLATE 10 MG PO TABS
10.0000 mg | ORAL_TABLET | Freq: Every morning | ORAL | Status: DC
  Administered 2015-11-23 – 2015-11-24 (×2): 10 mg via ORAL
  Filled 2015-11-22 (×2): qty 1

## 2015-11-22 MED ORDER — ASPIRIN 300 MG RE SUPP
300.0000 mg | RECTAL | Status: DC
Start: 2015-11-22 — End: 2015-11-23

## 2015-11-22 MED ORDER — CLONIDINE HCL 0.1 MG PO TABS
0.1000 mg | ORAL_TABLET | Freq: Four times a day (QID) | ORAL | Status: DC
  Administered 2015-11-22 – 2015-11-24 (×7): 0.1 mg via ORAL
  Filled 2015-11-22 (×8): qty 1

## 2015-11-22 MED ORDER — ASPIRIN 81 MG PO CHEW
324.0000 mg | CHEWABLE_TABLET | ORAL | Status: DC
Start: 2015-11-22 — End: 2015-11-23

## 2015-11-22 MED ORDER — ACETAMINOPHEN 325 MG PO TABS
650.0000 mg | ORAL_TABLET | ORAL | Status: DC | PRN
  Filled 2015-11-22: qty 2

## 2015-11-22 MED ORDER — ASPIRIN 81 MG PO CHEW
81.0000 mg | CHEWABLE_TABLET | Freq: Every morning | ORAL | Status: DC
  Administered 2015-11-23: 81 mg via ORAL
  Filled 2015-11-22: qty 1

## 2015-11-22 MED ORDER — SODIUM CHLORIDE 0.9 % IV SOLN: 250.0000 mL | INTRAVENOUS | Status: DC | PRN

## 2015-11-22 MED ORDER — ZOLPIDEM TARTRATE 5 MG PO TABS: 5.0000 mg | ORAL_TABLET | Freq: Every evening | ORAL | Status: DC | PRN

## 2015-11-22 MED ORDER — SODIUM CHLORIDE 0.9 % WEIGHT BASED INFUSION
1.0000 mL/kg/h | INTRAVENOUS | Status: DC
  Administered 2015-11-23: 1 mL/kg/h via INTRAVENOUS

## 2015-11-22 MED ORDER — PANTOPRAZOLE SODIUM 20 MG PO TBEC
20.0000 mg | DELAYED_RELEASE_TABLET | Freq: Every day | ORAL | Status: DC
  Administered 2015-11-23 – 2015-11-24 (×2): 20 mg via ORAL
  Filled 2015-11-22 (×2): qty 1

## 2015-11-22 MED ORDER — RANOLAZINE ER 500 MG PO TB12
1000.0000 mg | ORAL_TABLET | Freq: Two times a day (BID) | ORAL | Status: DC
  Administered 2015-11-22 – 2015-11-24 (×4): 1000 mg via ORAL
  Filled 2015-11-22 (×4): qty 2

## 2015-11-22 MED ORDER — SODIUM CHLORIDE 0.9% FLUSH
3.0000 mL | Freq: Two times a day (BID) | INTRAVENOUS | Status: DC
  Administered 2015-11-22: 3 mL via INTRAVENOUS

## 2015-11-22 MED ORDER — ASPIRIN EC 81 MG PO TBEC: 81.0000 mg | DELAYED_RELEASE_TABLET | Freq: Every day | ORAL | Status: DC

## 2015-11-22 MED ORDER — HEPARIN BOLUS VIA INFUSION
4000.0000 [IU] | Freq: Once | INTRAVENOUS | Status: AC
  Administered 2015-11-22: 4000 [IU] via INTRAVENOUS
  Filled 2015-11-22: qty 4000

## 2015-11-22 MED ORDER — METOPROLOL TARTRATE 100 MG PO TABS
100.0000 mg | ORAL_TABLET | Freq: Two times a day (BID) | ORAL | Status: DC
  Administered 2015-11-22 – 2015-11-23 (×3): 100 mg via ORAL
  Filled 2015-11-22 (×3): qty 1

## 2015-11-22 MED ORDER — INSULIN GLARGINE 100 UNIT/ML ~~LOC~~ SOLN
20.0000 [IU] | Freq: Every day | SUBCUTANEOUS | Status: DC
  Administered 2015-11-22 – 2015-11-23 (×2): 20 [IU] via SUBCUTANEOUS
  Filled 2015-11-22 (×3): qty 0.2

## 2015-11-22 MED ORDER — LISINOPRIL 40 MG PO TABS
40.0000 mg | ORAL_TABLET | Freq: Every morning | ORAL | Status: DC
  Administered 2015-11-23 – 2015-11-24 (×2): 40 mg via ORAL
  Filled 2015-11-22 (×2): qty 1

## 2015-11-22 MED ORDER — ASPIRIN 81 MG PO CHEW
81.0000 mg | CHEWABLE_TABLET | ORAL | Status: AC
  Administered 2015-11-23: 81 mg via ORAL

## 2015-11-22 MED ORDER — SPIRONOLACTONE 25 MG PO TABS
25.0000 mg | ORAL_TABLET | Freq: Every day | ORAL | Status: DC
  Administered 2015-11-23: 25 mg via ORAL
  Filled 2015-11-22: qty 1

## 2015-11-22 MED ORDER — ISOSORBIDE MONONITRATE ER 30 MG PO TB24
30.0000 mg | ORAL_TABLET | Freq: Every day | ORAL | Status: DC
  Administered 2015-11-22 – 2015-11-24 (×3): 30 mg via ORAL
  Filled 2015-11-22 (×3): qty 1

## 2015-11-22 MED ORDER — ONDANSETRON HCL 4 MG/2ML IJ SOLN: 4.0000 mg | Freq: Four times a day (QID) | INTRAMUSCULAR | Status: DC | PRN

## 2015-11-22 MED ORDER — GABAPENTIN 300 MG PO CAPS
300.0000 mg | ORAL_CAPSULE | Freq: Three times a day (TID) | ORAL | Status: DC
  Administered 2015-11-22 – 2015-11-24 (×5): 300 mg via ORAL
  Filled 2015-11-22 (×5): qty 1

## 2015-11-22 MED ORDER — PRAVASTATIN SODIUM 40 MG PO TABS
40.0000 mg | ORAL_TABLET | Freq: Every day | ORAL | Status: DC
  Administered 2015-11-22 – 2015-11-23 (×2): 40 mg via ORAL
  Filled 2015-11-22 (×2): qty 1

## 2015-11-22 MED ORDER — HEPARIN (PORCINE) IN NACL 100-0.45 UNIT/ML-% IJ SOLN
1400.0000 [IU]/h | INTRAMUSCULAR | Status: DC
  Administered 2015-11-22: 1400 [IU]/h via INTRAVENOUS
  Filled 2015-11-22 (×2): qty 250

## 2015-11-22 NOTE — H&P (Signed)
Admit date: 11/22/2015 Primary Physician  No PCP Per Patient Primary Cardiologist  Dr. Verdis Prime  CC: Chest pain  HPI: 55 year old male currently incarcerated who has had many admissions over the past 2-3 months with chest discomfort at [facility]. He was admitted here in late May 2017 with chest pain as well. A nuclear stress test was performed at that hospitalization which showed reversible defect in the apical/anterior apical region, felt to be overall low risk. If chest pain were to return, he was to consider outpatient cardiac catheterization by Dr. Katrinka Blazing.  Earlier today, I received a call from the hospitalist at [facility] stating that he is here again for the sixth time and asking me if we would accept him for potential cardiac catheterization.  He is described chest pain left-sided potentially change with nitroglycerin. Left arm pain as well. Not SOB. Blood pressure was extremely elevated upon arrival in the emergency room and is now well controlled in the 150 range. This is been a recurring theme as well despite administered medications at [facility].  Troponin was reportedly normal. EKG no significant changes from prior.  He has comorbidities of diabetes, pulmonary embolism 2014, MI in 2013-cardiac catheterization at that time showed nonobstructive disease. Less than 25% stenosis. Normal EF. In 2013 he was omitted 2 weeks after that cardiac catheterization and diagnosed with bilateral lower lobe PE. D-dimer on last admission was normal.  He is currently chest pain-free. Comfortable.     PMH:   Past Medical History  Diagnosis Date  . Hypertension   . Pulmonary embolism (HCC) 2014  . Myocardial infarction (HCC) 2013  . Uncontrolled type II diabetes mellitus (HCC)     Raymond Crane 11/08/2015  . High cholesterol   . GERD (gastroesophageal reflux disease)     PSH:   Past Surgical History  Procedure Laterality Date  . Cardiac catheterization   2013    at [facility]   . Inguinal hernia repair Right    Allergies:  Review of patient's allergies indicates no known allergies. Prior to Admit Meds:   Prior to Admission medications   Medication Sig Start Date End Date Taking? Authorizing Provider  amLODipine (NORVASC) 10 MG tablet Take 10 mg by mouth every morning.    Historical Provider, MD  aspirin 81 MG chewable tablet Chew 81 mg by mouth every morning.    Historical Provider, MD  cloNIDine (CATAPRES) 0.1 MG tablet Take 0.1 mg by mouth every 6 (six) hours. Hold if systolic blood pressure <160 and dystolic blood pressure <90    Historical Provider, MD  furosemide (LASIX) 40 MG tablet Take 1 tablet (40 mg total) by mouth daily. 11/13/15   Tasrif Ahmed, MD  gabapentin (NEURONTIN) 300 MG capsule Take 300 mg by mouth 3 (three) times daily.    Historical Provider, MD  hydrALAZINE (APRESOLINE) 50 MG tablet Take 50 mg by mouth every 8 (eight) hours.     Historical Provider, MD  insulin glargine (LANTUS) 100 UNIT/ML injection Inject 35 Units into the skin at bedtime.    Historical Provider, MD  insulin regular (HUMULIN R) 100 units/mL injection Inject 2-12 Units into the skin 3 (three) times daily before meals. Per sliding scale: 200-249 = 2 units; 250-299 = 4 units; 300-349 = 6 units; 350-399 = 8 units; 400-499 = 10 units; 500-549 = 12 units; call provider if >550    Historical Provider, MD  isosorbide mononitrate (IMDUR) 30 MG 24 hr tablet Take 1 tablet (  30 mg total) by mouth daily. 11/13/15   Tasrif Ahmed, MD  lisinopril (PRINIVIL,ZESTRIL) 40 MG tablet Take 40 mg by mouth every morning.  09/23/15   Historical Provider, MD  metFORMIN (GLUCOPHAGE) 1000 MG tablet Take 1,000 mg by mouth 2 (two) times daily with a meal.    Historical Provider, MD  metoprolol (LOPRESSOR) 100 MG tablet Take 100 mg by mouth every 12 (twelve) hours.    Historical Provider, MD  pantoprazole (PROTONIX) 20 MG tablet Take 20 mg by mouth every morning.     Historical Provider, MD  pravastatin (PRAVACHOL) 40 MG tablet Take 40 mg by mouth at bedtime.    Historical Provider, MD  ranolazine (RANEXA) 500 MG 12 hr tablet Take 1,000 mg by mouth every 12 (twelve) hours.    Historical Provider, MD  spironolactone (ALDACTONE) 25 MG tablet Take 1 tablet (25 mg total) by mouth daily. 11/13/15   Hyacinth Meekerasrif Ahmed, MD   Fam HX:    Family History  Problem Relation Age of Onset  . Heart disease Mother   . Heart disease Mother   . Stroke Mother   . Diabetes Sister   . Diabetes Brother   . Hypertension Sister   . Hypertension Brother    Social HX:    Social History   Social History  . Marital Status: Single    Spouse Name: N/A  . Number of Children: N/A  . Years of Education: N/A   Occupational History  . Not on file.   Social History Main Topics  . Smoking status: Former Smoker -- 1.00 packs/day for 25 years    Types: Cigarettes    Quit date: 12/17/2014  . Smokeless tobacco: Never Used  . Alcohol Use: Yes     Comment: 11/09/2015 "nothing since July 2016"  . Drug Use: No  . Sexual Activity: Not Currently   Other Topics Concern  . Not on file   Social History Narrative     ROS:  Positive for chest pain. No bleeding, no syncope. All 11 ROS were addressed and are negative except what is stated in the HPI   Physical Exam: Blood pressure 156/92, pulse 56, temperature 97.8 F (36.6 C), temperature source Oral, resp. rate 13, height 6\' 1"  (1.854 m), weight 254 lb 13.6 oz (115.6 kg), SpO2 97 %.   General: Well developed, well nourished, in no acute distress Head: Eyes PERRLA, No xanthomas.   Normal cephalic and atramatic  Lungs:  Clear bilaterally to auscultation and percussion. Normal respiratory effort. No wheezes, no rales. Heart:  HRRR S1 S2 Pulses are 2+ & equal. No murmurs, rubs or gallops.             No carotid bruit. No JVD.  No abdominal bruits. Abdomen: Bowel sounds are positive, abdomen soft and non-tender without masses or                  Hernia's noted. No hepatosplenomegaly. Obese Msk:  Back normal, normal gait. Normal strength and tone for age. Extremities:  No clubbing, cyanosis or edema.  DP +1 Neuro: Alert and oriented X 3, non-focal, MAE x 4 GU: Deferred Rectal: Deferred Psych:  Good affect, responds appropriately         Labs:   Lab Results  Component Value Date   WBC 5.6 11/08/2015   HGB 13.3 11/08/2015   HCT 39.8 11/08/2015   MCV 82.6 11/08/2015   PLT 163 11/08/2015   No results for input(s): NA, K, CL, CO2, BUN,  CREATININE, CALCIUM, PROT, BILITOT, ALKPHOS, ALT, AST, GLUCOSE in the last 168 hours.  Invalid input(s): LABALBU No results for input(s): CKTOTAL, CKMB, TROPONINI in the last 72 hours. Lab Results  Component Value Date   CHOL 182 11/08/2015   HDL 41 11/08/2015   LDLCALC 122* 11/08/2015   TRIG 96 11/08/2015   Lab Results  Component Value Date   DDIMER 0.32 11/09/2015     Radiology:  No results found. Personally viewed.   Nuclear stress test 11/10/15: IMPRESSION: 1. There is concern for focal reversibility along the anterior apex. Ischemia cannot be excluded at this location.  2. Decreased uptake along the inferior wall on both the rest and stress images with normal wall motion. This may represent diaphragmatic attenuation.  3. Normal wall motion with calculated ejection fraction of 53%.  4. Non invasive risk stratification*: Intermediate   EKG: Sinus rhythm, no significant ST changes, no changes from prior  Personally viewed.  ASSESSMENT/PLAN:   55 year old male with diabetes, hypertension with recent hypertensive urgency, multiple admissions with chest pain, abnormal nuclear stress test, currently incarcerated here for cardiac catheterization.  Unstable angina  - Given his multiple admissions with chest discomfort and intermediate risk stress test as described above, we will proceed with cardiac catheterization tomorrow. Risks and benefits of the procedure  including stroke, heart attack, death, renal impairment, bleeding have been discussed. He is willing to proceed.  - He has been on multiple antianginal medications including Ranexa, Imdur.  - Prior cardiac catheterization in 2013 was unremarkable with nonobstructive disease.  - Certainly his chest pain could be of noncardiac etiology.  Essential hypertension  - Continue with multidrug regimen  - Renal ultrasound showed no evidence of renal artery stenosis.  - Recently start Aldactone.  - BP has been an issue.   - Sometimes will tense arm with auto cuff to increase value.   Type 2 diabetes  - Hold metformin  - Continue current Lantus dose  Discussed case with Dr. Katrinka Blazing who is in cath lab tomorrow. May be able to go second case.   Donato Schultz, MD  11/22/2015  5:26 PM

## 2015-11-22 NOTE — Progress Notes (Signed)
ANTICOAGULATION CONSULT NOTE - Initial Consult  Pharmacy Consult for Heparin Indication: chest pain/ACS  No Known Allergies  Patient Measurements: Height: 6\' 1"  (185.4 cm) Weight: 254 lb 13.6 oz (115.6 kg) IBW/kg (Calculated) : 79.9 Heparin Dosing Weight: 105 kg  Vital Signs: Temp: 97.8 F (36.6 C) (06/06 1717) Temp Source: Oral (06/06 1717) BP: 156/92 mmHg (06/06 1717) Pulse Rate: 56 (06/06 1717)  Labs: No results for input(s): HGB, HCT, PLT, APTT, LABPROT, INR, HEPARINUNFRC, HEPRLOWMOCWT, CREATININE, CKTOTAL, CKMB, TROPONINI in the last 72 hours.  Estimated Creatinine Clearance: 103.7 mL/min (by C-G formula based on Cr of 1.06).   Medical History: Past Medical History  Diagnosis Date  . Hypertension   . Pulmonary embolism (HCC) 2014  . Myocardial infarction (HCC) 2013  . Uncontrolled type II diabetes mellitus (HCC)     [name]/notes 11/08/2015  . High cholesterol   . GERD (gastroesophageal reflux disease)     Assessment: 89 YOM who [facility] for evaluation of CP and potential cardiac cath. The patient is noted to have several episodes for CP over the past 2-3 months - cardiology is planning to cath on 6/7. Pharmacy was consulted to start heparin for CP/USA.   The patient has no recent surgeries or hx CVA noted. The patient was not on any blood thinners PTA except for a baby ASA. Labs [facility] from [facility]-6/6 show INR 1, aPTT 23, Hgb 12.3, plts 129. Records do not indicate that the patient received any heparin while at [facility].   Goal of Therapy:  Heparin level 0.3-0.7 units/ml Monitor platelets by anticoagulation protocol: Yes   Plan:  1. Heparin bolus of 4000 units x 1 2. Start heparin at a drip rate of 1400 units/hr (14 ml/hr) 3. Daily heparin level and CBC 4. Will continue to monitor for any signs/symptoms of bleeding and will follow up with heparin level in 6 hours   Georgina PillionElizabeth Leanore Biggers, PharmD, BCPS Clinical  Pharmacist Pager: [phone] 11/22/2015 6:27 PM

## 2015-11-23 ENCOUNTER — Encounter: Payer: Self-pay | Admitting: Physician Assistant

## 2015-11-23 ENCOUNTER — Encounter (HOSPITAL_COMMUNITY)
Admission: AD | Disposition: A | Discharge status: Other Acute Inpatient Hospital | Payer: Self-pay | Source: Other Acute Inpatient Hospital | Attending: Cardiology

## 2015-11-23 DIAGNOSIS — I10 Essential (primary) hypertension: Secondary | ICD-10-CM

## 2015-11-23 DIAGNOSIS — R079 Chest pain, unspecified: Secondary | ICD-10-CM

## 2015-11-23 HISTORY — PX: CARDIAC CATHETERIZATION: SHX172

## 2015-11-23 LAB — CBC
HCT: 39.9 % (ref 39.0–52.0)
HEMATOCRIT: 43.1 % (ref 39.0–52.0)
HEMOGLOBIN: 14.2 g/dL (ref 13.0–17.0)
Hemoglobin: 13 g/dL (ref 13.0–17.0)
MCH: 27.1 pg (ref 26.0–34.0)
MCH: 27.8 pg (ref 26.0–34.0)
MCHC: 32.6 g/dL (ref 30.0–36.0)
MCHC: 32.9 g/dL (ref 30.0–36.0)
MCV: 83.3 fL (ref 78.0–100.0)
MCV: 84.5 fL (ref 78.0–100.0)
PLATELETS: 148 10*3/uL — AB (ref 150–400)
Platelets: 146 10*3/uL — ABNORMAL LOW (ref 150–400)
RBC: 4.79 MIL/uL (ref 4.22–5.81)
RBC: 5.1 MIL/uL (ref 4.22–5.81)
RDW: 14.2 % (ref 11.5–15.5)
RDW: 14.2 % (ref 11.5–15.5)
WBC: 5.4 10*3/uL (ref 4.0–10.5)
WBC: 6.6 10*3/uL (ref 4.0–10.5)

## 2015-11-23 LAB — BASIC METABOLIC PANEL
Anion gap: 11 (ref 5–15)
CO2: 25 mmol/L (ref 22–32)
CREATININE: 1 mg/dL (ref 0.61–1.24)
Calcium: 9.2 mg/dL (ref 8.9–10.3)
Chloride: 102 mmol/L (ref 101–111)
GFR calc Af Amer: >60 mL/min (ref >60)
Glucose, Bld: 151 mg/dL — ABNORMAL HIGH (ref 65–99)
Potassium: 3.9 mmol/L (ref 3.5–5.1)
SODIUM: 138 mmol/L (ref 135–145)

## 2015-11-23 LAB — GLUCOSE, CAPILLARY
GLUCOSE-CAPILLARY: 110 mg/dL — AB (ref 65–99)
GLUCOSE-CAPILLARY: 145 mg/dL — AB (ref 65–99)
GLUCOSE-CAPILLARY: 317 mg/dL — AB (ref 65–99)
Glucose-Capillary: 162 mg/dL — ABNORMAL HIGH (ref 65–99)
Glucose-Capillary: 321 mg/dL — ABNORMAL HIGH (ref 65–99)
Glucose-Capillary: 265 mg/dL — ABNORMAL HIGH (ref 65–99)

## 2015-11-23 LAB — CREATININE, SERUM: Creatinine, Ser: 1.03 mg/dL (ref 0.61–1.24)

## 2015-11-23 LAB — TROPONIN I: TROPONIN I: 0.03 ng/mL (ref <0.031)

## 2015-11-23 LAB — HEPARIN LEVEL (UNFRACTIONATED)
HEPARIN UNFRACTIONATED: 0.37 [IU]/mL (ref 0.30–0.70)
Heparin Unfractionated: 0.42 IU/mL (ref 0.30–0.70)

## 2015-11-23 SURGERY — LEFT HEART CATH AND CORONARY ANGIOGRAPHY
Anesthesia: LOCAL

## 2015-11-23 MED ORDER — LIDOCAINE HCL (PF) 1 % IJ SOLN
INTRAMUSCULAR | Status: AC
  Filled 2015-11-23: qty 30

## 2015-11-23 MED ORDER — HEPARIN SODIUM (PORCINE) 5000 UNIT/ML IJ SOLN
5000.0000 [IU] | Freq: Three times a day (TID) | INTRAMUSCULAR | Status: DC
  Administered 2015-11-23 – 2015-11-24 (×2): 5000 [IU] via SUBCUTANEOUS
  Filled 2015-11-23 (×2): qty 1

## 2015-11-23 MED ORDER — INSULIN ASPART 100 UNIT/ML ~~LOC~~ SOLN: 0.0000 [IU] | Freq: Three times a day (TID) | SUBCUTANEOUS | Status: DC

## 2015-11-23 MED ORDER — VERAPAMIL HCL 2.5 MG/ML IV SOLN
INTRAVENOUS | Status: DC | PRN
  Administered 2015-11-23: 10 mL via INTRA_ARTERIAL

## 2015-11-23 MED ORDER — HEPARIN (PORCINE) IN NACL 2-0.9 UNIT/ML-% IJ SOLN
INTRAMUSCULAR | Status: DC | PRN
  Administered 2015-11-23: 14:00:00

## 2015-11-23 MED ORDER — SODIUM CHLORIDE 0.9% FLUSH
3.0000 mL | Freq: Two times a day (BID) | INTRAVENOUS | Status: DC
  Administered 2015-11-23 – 2015-11-24 (×2): 3 mL via INTRAVENOUS

## 2015-11-23 MED ORDER — INSULIN ASPART 100 UNIT/ML ~~LOC~~ SOLN
0.0000 [IU] | Freq: Three times a day (TID) | SUBCUTANEOUS | Status: DC
  Administered 2015-11-23: 8 [IU] via SUBCUTANEOUS
  Administered 2015-11-24 (×2): 3 [IU] via SUBCUTANEOUS

## 2015-11-23 MED ORDER — FENTANYL CITRATE (PF) 100 MCG/2ML IJ SOLN
INTRAMUSCULAR | Status: AC
  Filled 2015-11-23: qty 2

## 2015-11-23 MED ORDER — VERAPAMIL HCL 2.5 MG/ML IV SOLN
INTRAVENOUS | Status: AC
  Filled 2015-11-23: qty 2

## 2015-11-23 MED ORDER — ONDANSETRON HCL 4 MG/2ML IJ SOLN: 4.0000 mg | Freq: Four times a day (QID) | INTRAMUSCULAR | Status: DC | PRN

## 2015-11-23 MED ORDER — MIDAZOLAM HCL 2 MG/2ML IJ SOLN
INTRAMUSCULAR | Status: AC
  Filled 2015-11-23: qty 2

## 2015-11-23 MED ORDER — SODIUM CHLORIDE 0.9 % WEIGHT BASED INFUSION
1.0000 mL/kg/h | INTRAVENOUS | Status: AC
  Administered 2015-11-23: 1 mL/kg/h via INTRAVENOUS

## 2015-11-23 MED ORDER — OXYCODONE-ACETAMINOPHEN 5-325 MG PO TABS
1.0000 | ORAL_TABLET | ORAL | Status: DC | PRN
  Administered 2015-11-23 – 2015-11-24 (×4): 2 via ORAL
  Administered 2015-11-24: 1 via ORAL
  Filled 2015-11-23: qty 2
  Filled 2015-11-23: qty 1
  Filled 2015-11-23 (×3): qty 2

## 2015-11-23 MED ORDER — IOPAMIDOL (ISOVUE-370) INJECTION 76%
INTRAVENOUS | Status: DC | PRN
  Administered 2015-11-23: 95 mL

## 2015-11-23 MED ORDER — SODIUM CHLORIDE 0.9 % IV SOLN: 250.0000 mL | INTRAVENOUS | Status: DC | PRN

## 2015-11-23 MED ORDER — MIDAZOLAM HCL 2 MG/2ML IJ SOLN
INTRAMUSCULAR | Status: DC | PRN
  Administered 2015-11-23: 1 mg via INTRAVENOUS

## 2015-11-23 MED ORDER — SODIUM CHLORIDE 0.9% FLUSH: 3.0000 mL | INTRAVENOUS | Status: DC | PRN

## 2015-11-23 MED ORDER — HEPARIN SODIUM (PORCINE) 1000 UNIT/ML IJ SOLN
INTRAMUSCULAR | Status: AC
  Filled 2015-11-23: qty 1

## 2015-11-23 MED ORDER — HYDRALAZINE HCL 20 MG/ML IJ SOLN
INTRAMUSCULAR | Status: AC
  Filled 2015-11-23: qty 1

## 2015-11-23 MED ORDER — HYDRALAZINE HCL 20 MG/ML IJ SOLN
INTRAMUSCULAR | Status: DC | PRN
  Administered 2015-11-23: 10 mg via INTRAVENOUS

## 2015-11-23 MED ORDER — HEPARIN SODIUM (PORCINE) 5000 UNIT/ML IJ SOLN: 5000.0000 [IU] | Freq: Three times a day (TID) | INTRAMUSCULAR | Status: DC

## 2015-11-23 MED ORDER — ACETAMINOPHEN 325 MG PO TABS
650.0000 mg | ORAL_TABLET | ORAL | Status: DC | PRN
  Administered 2015-11-24: 650 mg via ORAL

## 2015-11-23 MED ORDER — HEPARIN (PORCINE) IN NACL 2-0.9 UNIT/ML-% IJ SOLN
INTRAMUSCULAR | Status: AC
  Filled 2015-11-23: qty 1000

## 2015-11-23 MED ORDER — FENTANYL CITRATE (PF) 100 MCG/2ML IJ SOLN
INTRAMUSCULAR | Status: DC | PRN
  Administered 2015-11-23: 50 ug via INTRAVENOUS

## 2015-11-23 MED ORDER — HEPARIN (PORCINE) IN NACL 2-0.9 UNIT/ML-% IJ SOLN
INTRAMUSCULAR | Status: AC
  Filled 2015-11-23: qty 500

## 2015-11-23 MED ORDER — LIDOCAINE HCL (PF) 1 % IJ SOLN
INTRAMUSCULAR | Status: DC | PRN
  Administered 2015-11-23: 2 mL

## 2015-11-23 MED ORDER — HEPARIN SODIUM (PORCINE) 1000 UNIT/ML IJ SOLN
INTRAMUSCULAR | Status: DC | PRN
  Administered 2015-11-23: 5000 [IU] via INTRAVENOUS

## 2015-11-23 MED ORDER — ASPIRIN 81 MG PO CHEW
81.0000 mg | CHEWABLE_TABLET | Freq: Every day | ORAL | Status: DC
  Administered 2015-11-24: 81 mg via ORAL
  Filled 2015-11-23: qty 1

## 2015-11-23 SURGICAL SUPPLY — 9 items
CATH INFINITI 5 FR JL3.5 (CATHETERS) ×2 IMPLANT
CATH INFINITI JR4 5F (CATHETERS) ×2 IMPLANT
DEVICE RAD COMP TR BAND LRG (VASCULAR PRODUCTS) ×2 IMPLANT
GLIDESHEATH SLEND A-KIT 6F 22G (SHEATH) ×4 IMPLANT
KIT HEART LEFT (KITS) ×2 IMPLANT
PACK CARDIAC CATHETERIZATION (CUSTOM PROCEDURE TRAY) ×2 IMPLANT
TRANSDUCER W/STOPCOCK (MISCELLANEOUS) ×2 IMPLANT
TUBING CIL FLEX 10 FLL-RA (TUBING) ×2 IMPLANT
WIRE SAFE-T 1.5MM-J .035X260CM (WIRE) ×4 IMPLANT

## 2015-11-23 NOTE — Progress Notes (Signed)
ANTICOAGULATION CONSULT NOTE - Follow Up Consult  Pharmacy Consult for heparin Indication: USAP  Labs:  Recent Labs  11/22/15 1821 11/22/15 2352 11/23/15 0345  HGB 12.8*  --   --   HCT 39.0  --   --   PLT 145*  --   --   LABPROT 13.1  --   --   INR 0.97  --   --   HEPARINUNFRC  --   --  0.42  CREATININE 1.00  --   --   TROPONINI <0.03 <0.03  --     Assessment/Plan:  55yo male therapeutic on heparin with initial dosing for CP. Will continue gtt at current rate and confirm stable with additional level.   Raymond Crane, PharmD, BCPS  11/23/2015,4:14 AM

## 2015-11-23 NOTE — Progress Notes (Signed)
 Cardiologist: Dr. Smith Subjective:  Feels better, no SOB, no bleeding, no HA  Objective:  Vital Signs in the last 24 hours: Temp:  [97.8 F (36.6 C)-98.4 F (36.9 C)] 98.4 F (36.9 C) (06/07 0443) Pulse Rate:  [56-69] 69 (06/06 2100) Resp:  [13-16] 16 (06/06 2100) BP: (156-176)/(92-101) 176/101 mmHg (06/06 2100) SpO2:  [94 %-97 %] 94 % (06/06 2100) Weight:  [253 lb 1.4 oz (114.8 kg)-254 lb 13.6 oz (115.6 kg)] 253 lb 1.4 oz (114.8 kg) (06/07 0448)  Intake/Output from previous day: 06/06 0701 - 06/07 0700 In: 315.9 [P.O.:290; I.V.:25.9] Out: -    Physical Exam: General: Well developed, well nourished, in no acute distress. Head:  Normocephalic and atraumatic. Lungs: Clear to auscultation and percussion. Heart: Normal S1 and S2.  No murmur, rubs or gallops.  Abdomen: soft, non-tender, positive bowel sounds. Extremities: No clubbing or cyanosis. No edema.2+ radial Neurologic: Alert and oriented x 3.    Lab Results:  Recent Labs  11/22/15 1821 11/23/15 0615  WBC 5.1 5.4  HGB 12.8* 13.0  PLT 145* 148*    Recent Labs  11/22/15 1821 11/23/15 0615  NA 133* 138  K 3.6 3.9  CL 101 102  CO2 26 25  GLUCOSE 244* 151*  BUN <5* <5*  CREATININE 1.00 1.00    Recent Labs  11/22/15 2352 11/23/15 0615  TROPONINI <0.03 0.03   Hepatic Function Panel  Recent Labs  11/22/15 1821  PROT 6.1*  ALBUMIN 3.2*  AST 14*  ALT 18  ALKPHOS 68  BILITOT 0.6   No results for input(s): CHOL in the last 72 hours. No results for input(s): PROTIME in the last 72 hours.  Imaging: No results found. Personally viewed.   Telemetry: NO adverse rhythms Personally viewed.   EKG:  No change from prior. Personally viewed.  Cardiac Studies:  Cath P  Meds: Scheduled Meds: . amLODipine  10 mg Oral q morning - 10a  . aspirin  324 mg Oral NOW   Or  . aspirin  300 mg Rectal NOW  . aspirin  81 mg Oral q morning - 10a  . cloNIDine  0.1 mg Oral Q6H  . furosemide  40 mg Oral  Daily  . gabapentin  300 mg Oral TID  . hydrALAZINE  50 mg Oral Q8H  . insulin glargine  20 Units Subcutaneous QHS  . isosorbide mononitrate  30 mg Oral Daily  . lisinopril  40 mg Oral q morning - 10a  . metoprolol  100 mg Oral Q12H  . pantoprazole  20 mg Oral QAC breakfast  . pravastatin  40 mg Oral QHS  . ranolazine  1,000 mg Oral Q12H  . sodium chloride flush  3 mL Intravenous Q12H  . spironolactone  25 mg Oral Daily   Continuous Infusions: . sodium chloride    . heparin 1,400 Units/hr (11/22/15 2109)   PRN Meds:.sodium chloride, acetaminophen, nitroGLYCERIN, ondansetron (ZOFRAN) IV, sodium chloride flush, zolpidem  Assessment/Plan:  Principal Problem:   Chest pain with moderate risk of acute coronary syndrome Active Problems:   Type 2 diabetes mellitus, uncontrolled (HCC)   History of pulmonary embolism 2013   CAD - minor CAD at cath Jan 2013   Essential hypertension   Chest pain at rest   Unstable angina  - on Hep IV  - cath today  - troponin normal  - several [facility] with CP [facility]  - Prior Cath 2013 non obst dz  Essential hypertension  - meds reviewed  -   has been controlled recently  - nurse notes that with auto cuff he at times flexes arm, causing increased read.    Raymond SchultzMark Zakar Crane 11/23/2015, 10:11 AM     +

## 2015-11-23 NOTE — Progress Notes (Signed)
ANTICOAGULATION CONSULT NOTE  Pharmacy Consult for Heparin Indication: chest pain/ACS  No Known Allergies  Patient Measurements: Height: 6\' 1"  (185.4 cm) Weight: 253 lb 1.4 oz (114.8 kg) IBW/kg (Calculated) : 79.9 Heparin Dosing Weight: 105 kg  Vital Signs: Temp: 98.4 F (36.9 C) (06/07 0443) BP: 185/103 mmHg (06/07 1020) Pulse Rate: 60 (06/07 1025)  Labs:  Recent Labs  11/22/15 1821 11/22/15 2352 11/23/15 0345 11/23/15 0615 11/23/15 0946  HGB 12.8*  --   --  13.0  --   HCT 39.0  --   --  39.9  --   PLT 145*  --   --  148*  --   LABPROT 13.1  --   --   --   --   INR 0.97  --   --   --   --   HEPARINUNFRC  --   --  0.42  --  0.37  CREATININE 1.00  --   --  1.00  --   TROPONINI <0.03 <0.03  --  0.03  --     Estimated Creatinine Clearance: 109.6 mL/min (by C-G formula based on Cr of 1).   Medical History: Past Medical History  Diagnosis Date  . Hypertension   . Pulmonary embolism (HCC) 2014  . Myocardial infarction (HCC) 2013  . Uncontrolled type II diabetes mellitus (HCC)     [name]/notes 11/08/2015  . High cholesterol   . GERD (gastroesophageal reflux disease)     Assessment: [age] YOM who [facility] for evaluation of CP and potential cardiac cath. The patient is noted to have several episodes for CP over the past 2-3 months - cardiology is planning to cath today. Pharmacy was consulted to start heparin for CP/USA.   Heparin level is therapeutic x 2 at 0.42 and 0.37 on 1400 units/hr. Continue current rate and follow after cath for heparin plans. No bleeding noted, CBC stable.  Goal of Therapy:  Heparin level 0.3-0.7 units/ml Monitor platelets by anticoagulation protocol: Yes   Plan:  1. Continue heparin at a drip rate of 1400 units/hr (14 ml/hr) 2. Follow up post cath plans 3. Daily heparin level and CBC while on heparin drip 4. Will continue to monitor for any signs/symptoms of bleeding    Thank you for allowing us  to participate in this patients care. Signe Coltonya C Oluwademilade Kellett, PharmD Pager: [phone] 11/23/2015 11:17 AM

## 2015-11-23 NOTE — H&P (View-Only) (Signed)
Cardiologist: Dr. Katrinka Blazing Subjective:  Feels better, no SOB, no bleeding, no HA  Objective:  Vital Signs in the last 24 hours: Temp:  [97.8 F (36.6 C)-98.4 F (36.9 C)] 98.4 F (36.9 C) (06/07 0443) Pulse Rate:  [56-69] 69 (06/06 2100) Resp:  [13-16] 16 (06/06 2100) BP: (156-176)/(92-101) 176/101 mmHg (06/06 2100) SpO2:  [94 %-97 %] 94 % (06/06 2100) Weight:  [253 lb 1.4 oz (114.8 kg)-254 lb 13.6 oz (115.6 kg)] 253 lb 1.4 oz (114.8 kg) (06/07 0448)  Intake/Output from previous day: 06/06 0701 - 06/07 0700 In: 315.9 [P.O.:290; I.V.:25.9] Out: -    Physical Exam: General: Well developed, well nourished, in no acute distress. Head:  Normocephalic and atraumatic. Lungs: Clear to auscultation and percussion. Heart: Normal S1 and S2.  No murmur, rubs or gallops.  Abdomen: soft, non-tender, positive bowel sounds. Extremities: No clubbing or cyanosis. No edema.2+ radial Neurologic: Alert and oriented x 3.    Lab Results:  Recent Labs  11/22/15 1821 11/23/15 0615  WBC 5.1 5.4  HGB 12.8* 13.0  PLT 145* 148*    Recent Labs  11/22/15 1821 11/23/15 0615  NA 133* 138  K 3.6 3.9  CL 101 102  CO2 26 25  GLUCOSE 244* 151*  BUN <5* <5*  CREATININE 1.00 1.00    Recent Labs  11/22/15 2352 11/23/15 0615  TROPONINI <0.03 0.03   Hepatic Function Panel  Recent Labs  11/22/15 1821  PROT 6.1*  ALBUMIN 3.2*  AST 14*  ALT 18  ALKPHOS 68  BILITOT 0.6   No results for input(s): CHOL in the last 72 hours. No results for input(s): PROTIME in the last 72 hours.  Imaging: No results found. Personally viewed.   Telemetry: NO adverse rhythms Personally viewed.   EKG:  No change from prior. Personally viewed.  Cardiac Studies:  Cath P  Meds: Scheduled Meds: . amLODipine  10 mg Oral q morning - 10a  . aspirin  324 mg Oral NOW   Or  . aspirin  300 mg Rectal NOW  . aspirin  81 mg Oral q morning - 10a  . cloNIDine  0.1 mg Oral Q6H  . furosemide  40 mg Oral  Daily  . gabapentin  300 mg Oral TID  . hydrALAZINE  50 mg Oral Q8H  . insulin glargine  20 Units Subcutaneous QHS  . isosorbide mononitrate  30 mg Oral Daily  . lisinopril  40 mg Oral q morning - 10a  . metoprolol  100 mg Oral Q12H  . pantoprazole  20 mg Oral QAC breakfast  . pravastatin  40 mg Oral QHS  . ranolazine  1,000 mg Oral Q12H  . sodium chloride flush  3 mL Intravenous Q12H  . spironolactone  25 mg Oral Daily   Continuous Infusions: . sodium chloride    . heparin 1,400 Units/hr (11/22/15 2109)   PRN Meds:.sodium chloride, acetaminophen, nitroGLYCERIN, ondansetron (ZOFRAN) IV, sodium chloride flush, zolpidem  Assessment/Plan:  Principal Problem:   Chest pain with moderate risk of acute coronary syndrome Active Problems:   Type 2 diabetes mellitus, uncontrolled (HCC)   History of pulmonary embolism 2013   CAD - minor CAD at cath Jan 2013   Essential hypertension   Chest pain at rest   Unstable angina  - on Hep IV  - cath today  - troponin normal  - several admits with CP from prison  - Prior Cath 2013 non obst dz  Essential hypertension  - meds reviewed  -  has been controlled recently  - nurse notes that with auto cuff he at times flexes arm, causing increased read.    Raymond SchultzMark Crane 11/23/2015, 10:11 AM     +

## 2015-11-23 NOTE — Progress Notes (Signed)
Inpatient Diabetes Program Recommendations  AACE/ADA: New Consensus Statement on Inpatient Glycemic Control (2015)  Target Ranges:  Prepandial:   less than 140 mg/dL      Peak postprandial:   less than 180 mg/dL (1-2 hours)      Critically ill patients:  140 - 180 mg/dL   Lab Results  Component Value Date   GLUCAP 145* 11/23/2015   HGBA1C 11.2* 11/08/2015    Review of Glycemic Control  Inpatient Diabetes Program Recommendations:  Correction (SSI): add Novolog moderate scale TID + HS scale Thank you  Raymond ClimesGina Talis Iwan MSN, RN,CDE Inpatient Diabetes Coordinator [phone] (team pager)

## 2015-11-23 NOTE — Interval H&P Note (Signed)
Cath Lab Visit (complete for each Cath Lab visit)  Clinical Evaluation Leading to the Procedure:   ACS: No.  Non-ACS:    Anginal Classification: CCS III  Anti-ischemic medical therapy: Maximal Therapy (2 or more classes of medications)  Non-Invasive Test Results: Intermediate-risk stress test findings: cardiac mortality 1-3%/year  Prior CABG: No previous CABG      History and Physical Interval Note:  11/23/2015 12:55 PM  Raymond Crane  has presented today for surgery, with the diagnosis of unstable angina  The various methods of treatment have been discussed with the patient and family. After consideration of risks, benefits and other options for treatment, the patient has consented to  Procedure(s): Left Heart Cath and Coronary Angiography (N/A) as a surgical intervention .  The patient's history has been reviewed, patient examined, no change in status, stable for surgery.  I have reviewed the patient's chart and labs.  Questions were answered to the patient's satisfaction.     Lyn RecordsHenry W Bisma Klett III

## 2015-11-24 ENCOUNTER — Encounter (HOSPITAL_COMMUNITY): Payer: Self-pay | Admitting: Nurse Practitioner

## 2015-11-24 DIAGNOSIS — I2510 Atherosclerotic heart disease of native coronary artery without angina pectoris: Principal | ICD-10-CM

## 2015-11-24 LAB — GLUCOSE, CAPILLARY
GLUCOSE-CAPILLARY: 154 mg/dL — AB (ref 65–99)
Glucose-Capillary: 196 mg/dL — ABNORMAL HIGH (ref 65–99)

## 2015-11-24 MED ORDER — SPIRONOLACTONE 25 MG PO TABS
50.0000 mg | ORAL_TABLET | Freq: Every day | ORAL | Status: DC
  Administered 2015-11-24: 50 mg via ORAL
  Filled 2015-11-24: qty 2

## 2015-11-24 MED ORDER — CARVEDILOL 25 MG PO TABS: 25.0000 mg | ORAL_TABLET | Freq: Two times a day (BID) | ORAL | Status: DC

## 2015-11-24 MED ORDER — CARVEDILOL 25 MG PO TABS
25.0000 mg | ORAL_TABLET | Freq: Two times a day (BID) | ORAL | Status: DC
  Administered 2015-11-24: 25 mg via ORAL
  Filled 2015-11-24: qty 1

## 2015-11-24 MED ORDER — NITROGLYCERIN 0.4 MG SL SUBL: 0.4000 mg | SUBLINGUAL_TABLET | SUBLINGUAL | Status: AC | PRN

## 2015-11-24 MED ORDER — METFORMIN HCL 1000 MG PO TABS: 1000.0000 mg | ORAL_TABLET | Freq: Two times a day (BID) | ORAL | Status: AC

## 2015-11-24 MED ORDER — SPIRONOLACTONE 50 MG PO TABS: 50.0000 mg | ORAL_TABLET | Freq: Every day | ORAL | Status: AC

## 2015-11-24 NOTE — Discharge Instructions (Signed)
**  PLEASE REMEMBER TO BRING ALL OF YOUR MEDICATIONS TO EACH OF YOUR FOLLOW-UP OFFICE VISITS. ° °Radial Site Care °Refer to this sheet in the next few weeks. These instructions provide you with information on caring for yourself after your procedure. Your caregiver may also give you more specific instructions. Your treatment has been planned according to current medical practices, but problems sometimes occur. Call your caregiver if you have any problems or questions after your procedure. °HOME CARE INSTRUCTIONS °· You may shower the day after the procedure. Remove the bandage (dressing) and gently wash the site with plain soap and water. Gently pat the site dry.  °· Do not apply powder or lotion to the site.  °· Do not submerge the affected site in water for 3 to 5 days.  °· Inspect the site at least twice daily.  °· Do not flex or bend the affected arm for 24 hours.  °· No lifting over 5 pounds (2.3 kg) for 5 days after your procedure.  °· Do not drive home if you are discharged the same day of the procedure. Have someone else drive you.  ° °What to expect: °· Any bruising will usually fade within 1 to 2 weeks.  °· Blood that collects in the tissue (hematoma) may be painful to the touch. It should usually decrease in size and tenderness within 1 to 2 weeks.  °SEEK IMMEDIATE MEDICAL CARE IF: °· You have unusual pain at the radial site.  °· You have redness, warmth, swelling, or pain at the radial site.  °· You have drainage (other than a small amount of blood on the dressing).  °· You have chills.  °· You have a fever or persistent symptoms for more than 72 hours.  °· You have a fever and your symptoms suddenly get worse.  °· Your arm becomes pale, cool, tingly, or numb.  °· You have heavy bleeding from the site. Hold pressure on the site.  ° °

## 2015-11-24 NOTE — Progress Notes (Addendum)
   Subjective: No increase in chest pain. No shortness of breath  Objective: Cath site normal. Lungs are clear. Blood pressure elevated 168/102  Aortic pressure (central) elevated  Will change metoprolol 100 BID to Coreg 25 BID (? Better BP control with alpha) Will increase spiroronlactone to 50mg  from 25 QD On lasix 40 On clonidine On Hydral On Amlod  Cath ---med mgt 1. Mid RCA lesion, 50% stenosed. 2. Ost Cx lesion, 40% stenosed. 3. Ost 1st Mrg to 1st Mrg lesion, 40% stenosed. 4. Ost 2nd Mrg lesion, 70% stenosed. 5. 2nd Mrg lesion, 40% stenosed. 6. Ost 1st Diag lesion, 70% stenosed. 7. The left ventricular systolic function is normal.   No significant major vessel coronary obstruction. RCA 50%, ostial diagonal first diagonal 70%, proximal second marginal 70%, and 40% second obtuse marginal.  Normal LV systolic function. Elevated end-diastolic pressure.  Recommendation:   Medical therapy for hypertension.  I do not believe that the patient's nearly continuous chest discomfort is related to obstructive coronary disease. He does appear to have severe left ventricular hypertrophy. May have microvascular angina. Blood pressure control would also be essential to controlling this possibility.  Alert and oriented 3, regular rate and rhythm, clear to auscultation bilaterally, cath site normal.  Assessment and plan:  Difficult to control hypertension-multidrug regimen. Adjusted medications as above. Continue to monitor as outpatient.  Coronary artery disease-per cardiac catheterization, no significant major vessel obstruction. Continue try to control hypertension. Left ventricular hypertrophy.  OK for discharge. Continue close monitoring of blood pressure by primary physician.  Has appt coming up with Tereso NewcomerScott Weaver.  Donato SchultzMark Skains, MD

## 2015-11-24 NOTE — Discharge Summary (Signed)
Discharge Summary    Patient ID: Raymond Crane,  MRN: [id number], DOB/AGE: Dec 24, 1960 55 y.o.  Admit date: 11/22/2015 Discharge date: 11/24/2015  Primary Care Provider: No PCP Per Patient Primary Cardiologist: Mendel RyderH. Smith, MD   Discharge Diagnoses    Principal Problem:   Chest pain with moderate risk of acute coronary syndrome  **Nonobstructive CAD by cath this admission.  Active Problems:   Hypertensive urgency   Type 2 diabetes mellitus, uncontrolled (HCC)   History of pulmonary embolism 2013   CAD - minor CAD at cath Jan 2013 and June 2017   Essential hypertension   Chest pain at rest  Allergies No Known Allergies  Diagnostic Studies/Procedures    Cardiac Catheterization 6.7.2017 Coronary Findings     Dominance: Right    Left Anterior Descending   . First Diagonal Branch   The vessel is small in size.   Suezanne Jacquet. Ost 1st Diag lesion, 70% stenosed.   . Lateral First Diagonal Branch   The vessel is moderate in size.   . First Septal Branch   The vessel is small in size.   Marland Kitchen. Second Diagonal Branch   The vessel is small in size.   . Third Diagonal Branch   The vessel is small in size.      Left Circumflex   . Ost Cx lesion, 40% stenosed.   . First Obtuse Marginal Branch   . Ost 1st Mrg to 1st Mrg lesion, 40% stenosed.   . Second Obtuse Marginal Branch   The vessel is small in size.   Suezanne Jacquet. Ost 2nd Mrg lesion, 70% stenosed.   . 2nd Mrg lesion, 40% stenosed.      Right Coronary Artery   . Mid RCA lesion, 50% stenosed.     Left Ventricle The left ventricular size is normal. The left ventricular systolic function is normal. The left ventricular ejection fraction is 55-65% by visual estimate. _____________   History of Present Illness     55 year old male with a history of chest pain, hypertension, hyperlipidemia, diabetes, and pulmonary embolism who has had multiple admissions over the past 2-3 months related to chest pain. He was admitted to [facility] in late May 2017,  ruled out, and had a low risk stress test. He was subsequently discharged with plan for outpatient follow-up and consideration for outpatient catheterization if he were to have recurrent chest pain. Unfortunately, he did have recurrent chest discomfort and presented to [facility] on June 6. There, ECG was nonacute and troponin normal. He was transferred to [facility] for further evaluation.  Hospital Course     Consultants: None   Patient ruled out for myocardial infarction and was chest pain-free upon arrival. He underwent diagnostic catheterization on June 7, revealing moderate, nonobstructive CAD as outlined above. He was hypertensive throughout admission and we have adjusted his home medications, switching him from metoprolol to carvedilol, and titrating his home dose of spironolactone to 50 mg daily. He will be [facility] back to prison today in good condition. He does have follow-up in our office in one week at which point, we can reassess blood pressure management and evaluate basic metabolic panel in the setting of spironolactone titration. _____________  Discharge Vitals Blood pressure 168/102, pulse 53, temperature 98.4 F (36.9 C), temperature source Oral, resp. rate 14, height 6\' 1"  (1.854 m), weight 248 lb 8 oz (112.719 kg), SpO2 95 %.  Filed Weights   11/22/15 1717 11/23/15 0448 11/24/15 0619  Weight: 254 lb 13.6  oz (115.6 kg) 253 lb 1.4 oz (114.8 kg) 248 lb 8 oz (112.719 kg)    Labs & Radiologic Studies    CBC  Recent Labs  11/22/15 1821 11/23/15 0615 11/23/15 1546  WBC 5.1 5.4 6.6  NEUTROABS 3.1  --   --   HGB 12.8* 13.0 14.2  HCT 39.0 39.9 43.1  MCV 83.5 83.3 84.5  PLT 145* 148* 146*   Basic Metabolic Panel  Recent Labs  11/22/15 1821 11/23/15 0615 11/23/15 1546  NA 133* 138  --   K 3.6 3.9  --   CL 101 102  --   CO2 26 25  --   GLUCOSE 244* 151*  --   BUN <5* <5*  --   CREATININE 1.00 1.00 1.03  CALCIUM 8.7* 9.2  --   MG 1.9  --   --    Liver  Function Tests  Recent Labs  11/22/15 1821  AST 14*  ALT 18  ALKPHOS 68  BILITOT 0.6  PROT 6.1*  ALBUMIN 3.2*   Cardiac Enzymes  Recent Labs  11/22/15 1821 11/22/15 2352 11/23/15 0615  TROPONINI <0.03 <0.03 0.03   Thyroid Function Tests  Recent Labs  11/22/15 1821  TSH 0.669   _____________  Dg Chest 2 View  11/08/2015  CLINICAL DATA:  Chest pain EXAM: CHEST  2 VIEW COMPARISON:  None. FINDINGS: Normal heart size. Normal mediastinal contour. No pneumothorax. No pleural effusion. Lungs appear clear, with no acute consolidative airspace disease and no pulmonary edema. IMPRESSION: No active cardiopulmonary disease. Electronically Signed   By: Delbert Phenix M.D.   On: 11/08/2015 18:57   Ct Head Wo Contrast  11/08/2015  CLINICAL DATA:  Headache, hypertension, dizziness. Symptom onset this afternoon. EXAM: CT HEAD WITHOUT CONTRAST TECHNIQUE: Contiguous axial images were obtained from the base of the skull through the vertex without intravenous contrast. COMPARISON:  None. FINDINGS: Brain: No evidence of acute infarction, hemorrhage, extra-axial collection, ventriculomegaly, or mass effect. Vascular: No hyperdense vessel or unexpected calcification. Skull: Negative for fracture or focal lesion. Sinuses/Orbits: No acute findings. Other: Suspect small subgaleal hematoma about the left posterior parietal scalp, age-indeterminate. IMPRESSION: No acute intracranial abnormality. Electronically Signed   By: Rubye Oaks M.D.   On: 11/08/2015 21:17   Nm Myocar Multi W/spect W/wall Motion / Ef  11/10/2015  CLINICAL DATA:  55 year old with chest pain. EXAM: MYOCARDIAL IMAGING WITH SPECT (REST AND PHARMACOLOGIC-STRESS) GATED LEFT VENTRICULAR WALL MOTION STUDY LEFT VENTRICULAR EJECTION FRACTION TECHNIQUE: Standard myocardial SPECT imaging was performed after resting intravenous injection of 10 mCi Tc-62m tetrofosmin. Subsequently, intravenous infusion of Lexiscan was performed under the  supervision of the Cardiology staff. At peak effect of the drug, 30 mCi Tc-53m tetrofosmin was injected intravenously and standard myocardial SPECT imaging was performed. Quantitative gated imaging was also performed to evaluate left ventricular wall motion, and estimate left ventricular ejection fraction. COMPARISON:  None. FINDINGS: Perfusion: Concern for reversibility along the anterior apex. Study has some technical limitations due to differences in left ventricle orientation between the rest and stress images. There is decreased uptake along the inferior wall on both the rest and stress images which may represent diaphragm attenuation. Wall Motion: Normal left ventricular wall motion. Mild left ventricle dilatation. Left Ventricular Ejection Fraction: 53 % End diastolic volume 157 ml End systolic volume 74 ml IMPRESSION: 1. There is concern for focal reversibility along the anterior apex. Ischemia cannot be excluded at this location. 2. Decreased uptake along the inferior wall on both the  rest and stress images with normal wall motion. This may represent diaphragmatic attenuation. 3.  Normal wall motion with calculated ejection fraction of 53%. 4. Non invasive risk stratification*: Intermediate *2012 Appropriate Use Criteria for Coronary Revascularization Focused Update: J Am Coll Cardiol. 2012;59(9):857-881. http://content.dementiazones.com.aspx?articleid=[email or web address] Electronically Signed   By: Richarda Overlie M.D.   On: 11/10/2015 15:36   Disposition   Pt is being discharged home today in good condition.  Follow-up Plans & Appointments    Follow-up Information    Follow up with Tereso Newcomer, PA-C On 11/28/2015.   Specialties:  Cardiology, Physician Assistant   Why:  11:45 AM   Contact information:   [address] [place] [address] [phone]        Discharge Medications   Current Discharge Medication List    START taking these medications   Details  carvedilol  (COREG) 25 MG tablet Take 1 tablet (25 mg total) by mouth 2 (two) times daily with a meal. Qty: 60 tablet, Refills: 3    nitroGLYCERIN (NITROSTAT) 0.4 MG SL tablet Place 1 tablet (0.4 mg total) under the tongue every 5 (five) minutes x 3 doses as needed for chest pain. Qty: 25 tablet, Refills: 3      CONTINUE these medications which have CHANGED   Details  metFORMIN (GLUCOPHAGE) 1000 MG tablet Take 1 tablet (1,000 mg total) by mouth 2 (two) times daily with a meal.    spironolactone (ALDACTONE) 50 MG tablet Take 1 tablet (50 mg total) by mouth daily. Qty: 30 tablet, Refills: 3      CONTINUE these medications which have NOT CHANGED   Details  amLODipine (NORVASC) 10 MG tablet Take 10 mg by mouth every morning.    aspirin 81 MG chewable tablet Chew 81 mg by mouth every morning.    cloNIDine (CATAPRES) 0.1 MG tablet Take 0.1 mg by mouth every 6 (six) hours. Hold if systolic blood pressure <160 and dystolic blood pressure <90    furosemide (LASIX) 40 MG tablet Take 1 tablet (40 mg total) by mouth daily. Qty: 30 tablet, Refills: 0    gabapentin (NEURONTIN) 300 MG capsule Take 300 mg by mouth 3 (three) times daily.    hydrALAZINE (APRESOLINE) 50 MG tablet Take 50 mg by mouth every 8 (eight) hours.     insulin glargine (LANTUS) 100 UNIT/ML injection Inject 35 Units into the skin at bedtime.    insulin regular (HUMULIN R) 100 units/mL injection Inject 2-12 Units into the skin 3 (three) times daily before meals. Per sliding scale: 200-249 = 2 units; 250-299 = 4 units; 300-349 = 6 units; 350-399 = 8 units; 400-499 = 10 units; 500-549 = 12 units; call provider if >550    isosorbide mononitrate (IMDUR) 30 MG 24 hr tablet Take 1 tablet (30 mg total) by mouth daily. Qty: 30 tablet, Refills: 2    lisinopril (PRINIVIL,ZESTRIL) 40 MG tablet Take 40 mg by mouth every morning.  Refills: 0    pantoprazole (PROTONIX) 20 MG tablet Take 20 mg by mouth every morning.    pravastatin (PRAVACHOL) 40  MG tablet Take 40 mg by mouth at bedtime.    ranolazine (RANEXA) 500 MG 12 hr tablet Take 1,000 mg by mouth every 12 (twelve) hours.      STOP taking these medications     metoprolol (LOPRESSOR) 100 MG tablet         Outstanding Labs/Studies   Follow-up BMET in 1 wk (increased dose of spironolactone)  Duration of  Discharge Encounter   Greater than 30 minutes including physician time.  Signed, Nicolasa Ducking NP 11/24/2015, 11:44 AM    Personally seen and examined. Agree with above. Cath - no large vessel CAD. Med mgt. HTN control Changed meds as above.   Donato Schultz, MD

## 2015-11-28 ENCOUNTER — Encounter: Admitting: Physician Assistant

## 2015-12-06 ENCOUNTER — Encounter: Admitting: Physician Assistant

## 2015-12-14 DIAGNOSIS — Z86711 Personal history of pulmonary embolism: Secondary | ICD-10-CM | POA: Diagnosis not present

## 2015-12-14 DIAGNOSIS — E1165 Type 2 diabetes mellitus with hyperglycemia: Secondary | ICD-10-CM | POA: Diagnosis not present

## 2015-12-14 DIAGNOSIS — E785 Hyperlipidemia, unspecified: Secondary | ICD-10-CM | POA: Diagnosis not present

## 2015-12-14 DIAGNOSIS — I160 Hypertensive urgency: Secondary | ICD-10-CM | POA: Diagnosis not present

## 2015-12-15 DIAGNOSIS — I2510 Atherosclerotic heart disease of native coronary artery without angina pectoris: Secondary | ICD-10-CM

## 2015-12-15 DIAGNOSIS — E1165 Type 2 diabetes mellitus with hyperglycemia: Secondary | ICD-10-CM | POA: Diagnosis not present

## 2015-12-15 DIAGNOSIS — E785 Hyperlipidemia, unspecified: Secondary | ICD-10-CM | POA: Diagnosis not present

## 2015-12-15 DIAGNOSIS — I160 Hypertensive urgency: Secondary | ICD-10-CM | POA: Diagnosis not present

## 2015-12-15 DIAGNOSIS — E669 Obesity, unspecified: Secondary | ICD-10-CM

## 2015-12-15 DIAGNOSIS — Z86711 Personal history of pulmonary embolism: Secondary | ICD-10-CM

## 2015-12-26 ENCOUNTER — Encounter: Payer: Self-pay | Admitting: Physician Assistant

## 2015-12-26 ENCOUNTER — Ambulatory Visit (INDEPENDENT_AMBULATORY_CARE_PROVIDER_SITE_OTHER): Admitting: Physician Assistant

## 2015-12-26 VITALS — BP 138/90 | HR 52 | Ht 73.0 in | Wt 261.0 lb

## 2015-12-26 DIAGNOSIS — R079 Chest pain, unspecified: Secondary | ICD-10-CM | POA: Diagnosis not present

## 2015-12-26 DIAGNOSIS — I2510 Atherosclerotic heart disease of native coronary artery without angina pectoris: Secondary | ICD-10-CM

## 2015-12-26 DIAGNOSIS — I119 Hypertensive heart disease without heart failure: Secondary | ICD-10-CM | POA: Diagnosis not present

## 2015-12-26 DIAGNOSIS — E785 Hyperlipidemia, unspecified: Secondary | ICD-10-CM | POA: Diagnosis not present

## 2015-12-26 MED ORDER — HYDRALAZINE HCL 50 MG PO TABS: 75.0000 mg | ORAL_TABLET | Freq: Three times a day (TID) | ORAL | Status: DC

## 2015-12-26 NOTE — Patient Instructions (Addendum)
Medication Instructions:  1. INCREASE HYDRALAZINE TO 75 MG THREE TIMES A DAY Labwork: NONE Testing/Procedures: NONE Follow-Up: DR. Katrinka BlazingSMITH IN 3 MONTHS  Any Other Special Instructions Will Be Listed Below (If Applicable). IF YOU DO RETURN TO [place] AFTER RELEASE YOU WILL NEED TO ESTABLISH WITH A CARDIOLOGIST THERE.  If you need a refill on your cardiac medications before your next appointment, please call your pharmacy.

## 2015-12-26 NOTE — Progress Notes (Signed)
Cardiology Office Note:    Date:  12/26/2015   ID:  Raymond Crane [place], DOB [birth year], MRN [id number]  PCP:  No PCP Per Patient  Cardiologist:  Dr. Verdis Crane Crane   Electrophysiologist:  n/a  Referring MD: No ref. provider found   Chief Complaint  Patient presents with  . Hospitalization Follow-up    chest pain - s/p cath    History of Present Illness:     Raymond Crane Raymond Crane is a 55 y.o. incarcerated male with a hx of chest pain, HTN, HL, diabetes, pulmonary embolism in 2013. Patient has had several admissions over the past 2-3 months with chest pain. Stress test in 5/17 was low risk.   Admitted 6/6-6/8 with recurrent chest pain. He was transferred from [facility]. Myocardial infarction was ruled out by enzymes. Cardiac catheterization demonstrated moderate, nonobstructive CAD. He was hypertensive throughout admission and medications were adjusted.   Returns for FU.  [facility] [place].  He is here with 2 guards today.  He will apparently be released in 60 days.  He is originally from [place].  He continues to have episodes of chest pain.  These will occur for several days. His paperwork from prison suggests marked elevation in BP when he has chest pain.  He notes assoc arm pain and dyspnea.  Denies syncope.  Denies PND, edema.  Denies smoking.  Admits to taking all of his medications.   Past Medical History  Diagnosis Date  . Hypertension   . Pulmonary embolism (HCC) 2014  . Myocardial infarction (HCC) 2013  . Uncontrolled type II diabetes mellitus (HCC)     [name]/notes 11/08/2015  . High cholesterol   . GERD (gastroesophageal reflux disease)   . Non-obstructive CAD     a. 10/2011 NSTEMI/Cath: nonobs dzs, EF nl-->Med Rx; b. 10/2015 MV: apical/anterior/apical reversible defect->low risk->Med rx; c. 11/2015 Cath: LM nl, LAD nl, D1 70ost, LCX 40, OM1 40, Om2 40, RCA 3624m, EF 55-65%.    Past Surgical History  Procedure Laterality Date  . Cardiac catheterization  2013    at  [facility]   . Inguinal hernia repair Right   . Cardiac catheterization N/A 11/23/2015    Procedure: Left Heart Cath and Coronary Angiography;  Surgeon: Lyn RecordsHenry W Smith, MD;  [facility];  Service: Cardiovascular;  Laterality: N/A;    Current Medications: Outpatient Prescriptions Prior to Visit  Medication Sig Dispense Refill  . amLODipine (NORVASC) 10 MG tablet Take 10 mg by mouth every morning.    Marland Kitchen. aspirin 81 MG chewable tablet Chew 81 mg by mouth every morning.    . cloNIDine (CATAPRES) 0.1 MG tablet Take 0.2 mg by mouth every 6 (six) hours. Hold if systolic blood pressure <160 and dystolic blood pressure <90    . furosemide (LASIX) 40 MG tablet Take 1 tablet (40 mg total) by mouth daily. 30 tablet 0  . gabapentin (NEURONTIN) 300 MG capsule Take 300 mg by mouth 3 (three) times daily.    . insulin glargine (LANTUS) 100 UNIT/ML injection Inject 35 Units into the skin at bedtime.    . insulin regular (HUMULIN R) 100 units/mL injection Inject 2-12 Units into the skin 3 (three) times daily before meals. Per sliding scale: 200-249 = 2 units; 250-299 = 4 units; 300-349 = 6 units; 350-399 = 8 units; 400-499 = 10 units; 500-549 = 12 units; call provider if >550    . isosorbide mononitrate (IMDUR) 30 MG 24 hr tablet Take  1 tablet (30 mg total) by mouth daily. 30 tablet 2  . lisinopril (PRINIVIL,ZESTRIL) 40 MG tablet Take 40 mg by mouth every morning.   0  . metFORMIN (GLUCOPHAGE) 1000 MG tablet Take 1 tablet (1,000 mg total) by mouth 2 (two) times daily with a meal.    . nitroGLYCERIN (NITROSTAT) 0.4 MG SL tablet Place 1 tablet (0.4 mg total) under the tongue every 5 (five) minutes x 3 doses as needed for chest pain. 25 tablet 3  . pantoprazole (PROTONIX) 20 MG tablet Take 20 mg by mouth every morning.    . pravastatin (PRAVACHOL) 40 MG tablet Take 40 mg by mouth at bedtime.    . ranolazine (RANEXA) 500 MG 12 hr tablet Take 1,000 mg by mouth every 12 (twelve) hours.    Marland Kitchen  spironolactone (ALDACTONE) 50 MG tablet Take 1 tablet (50 mg total) by mouth daily. 30 tablet 3  . hydrALAZINE (APRESOLINE) 50 MG tablet Take 50 mg by mouth every 8 (eight) hours.     . carvedilol (COREG) 25 MG tablet Take 1 tablet (25 mg total) by mouth 2 (two) times daily with a meal. (Patient not taking: Reported on 12/26/2015) 60 tablet 3   No facility-administered medications prior to visit.      Allergies:   Review of patient's allergies indicates no known allergies.   Social History   Social History  . Marital Status: Single    Spouse Name: N/A  . Number of Children: N/A  . Years of Education: N/A   Social History Main Topics  . Smoking status: Former Smoker -- 1.00 packs/day for 25 years    Types: Cigarettes    Quit date: 12/17/2014  . Smokeless tobacco: Never Used  . Alcohol Use: Yes     Comment: 11/09/2015 "nothing since July 2016"  . Drug Use: No  . Sexual Activity: Not Currently   Other Topics Concern  . None   Social History Narrative     Family History:  The patient's family history includes Diabetes in his brother and sister; Heart disease in his mother and mother; Hypertension in his brother and sister; Stroke in his mother.   ROS:   Please see the history of present illness.    ROS All other systems reviewed and are negative.   Physical Exam:    VS:  BP 138/90 mmHg  Pulse 52  Ht  (1.854 m)  Wt 261 lb (118.389 kg)  BMI 34.44 kg/m2   Physical Exam  Constitutional: He is oriented to person, place, and time. He appears well-developed and well-nourished.  HENT:  Head: Normocephalic and atraumatic.  Neck: No JVD present.  Cardiovascular: Normal rate, regular rhythm and normal heart sounds.   No murmur heard. Pulmonary/Chest: He has no wheezes. He has no rales.  Abdominal: Soft. He exhibits no mass. There is no tenderness.  Musculoskeletal: He exhibits no edema.  right wrist without hematoma or mass   Neurological: He is alert and oriented to  person, place, and time.  Skin: Skin is warm and dry.  Psychiatric: He has a normal mood and affect.    Wt Readings from Last 3 Encounters:  12/26/15 261 lb (118.389 kg)  11/24/15 248 lb 8 oz (112.719 kg)  11/13/15 246 lb 1.6 oz (111.63 kg)      Studies/Labs Reviewed:     EKG:  EKG is  ordered today.  The ekg ordered today demonstrates sinus brady, HR 52, normal axis, QTc 405 ms, NSSTTW changes.  Recent Labs: 11/22/2015: ALT 18; Magnesium 1.9; TSH 0.669 11/23/2015: BUN <5*; Creatinine, Ser 1.03; Hemoglobin 14.2; Platelets 146*; Potassium 3.9; Sodium 138   Recent Lipid Panel    Component Value Date/Time   CHOL 182 11/08/2015 2238   TRIG 96 11/08/2015 2238   HDL 41 11/08/2015 2238   CHOLHDL 4.4 11/08/2015 2238   VLDL 19 11/08/2015 2238   LDLCALC 122* 11/08/2015 2238    Additional studies/ records that were reviewed today include:   Myoview 11/10/15 IMPRESSION: 1. There is concern for focal reversibility along the anterior apex. Ischemia cannot be excluded at this location. 2. Decreased uptake along the inferior wall on both the rest and stress images with normal wall motion. This may represent diaphragmatic attenuation. 3.  Normal wall motion with calculated ejection fraction of 53%. 4. Non invasive risk stratification*: Intermediate  RA Duplex 11/11/15 No evidence of renal artery stenosis noted bilaterally.  LHC 11/23/15 LAD - ostial D1 70% LCx ostial 40%, OM1 40%, OM2 70%, 40% RCA mid 50% EF 55-55%  No significant major vessel coronary obstruction. RCA 50%, ostial diagonal first diagonal 70%, proximal second marginal 70%, and 40% second obtuse marginal.  Normal LV systolic function. Elevated end-diastolic pressure. Recommendation:  Medical therapy for hypertension.  I do not believe that the patient's nearly continuous chest discomfort is related to obstructive coronary disease. He does appear to have severe left ventricular hypertrophy. May have microvascular  angina. Blood pressure control would also be essential to controlling this possibility.     ASSESSMENT:     1. Chest pain at rest   2. Coronary artery disease involving native coronary artery of native heart without angina pectoris   3. Hypertensive heart disease without CHF   4. Hyperlipidemia     PLAN:     In order of problems listed above:  1. Chest pain - As noted, recent cardiac catheterization demonstrated moderate nonobstructive disease. There was no clear culprit for his chest pain. Question if his chest discomfort is related to hypertensive urgency. He tends to have significant elevations in blood pressure around the time of his chest symptoms. No further cardiac workup warranted at this time.  2. CAD - He needs continued aggressive risk factor modification. We'll continue to adjust blood pressure medications. Continue aspirin, beta blocker, statin, nitrates, ranolazine.  3. Hypertensive heart disease - Continue aggressive risk factor modification. He is on several medications for blood pressure. He does admit to taking his medications. Renal artery duplex was negative for renal artery stenosis. Blood pressure remains somewhat uncontrolled. Increase hydralazine to 75 mg 3 times a day. Continue current dose of Norvasc, carvedilol, clonidine, furosemide, isosorbide, lisinopril, spironolactone. Recent BMET done in prison with normal potassium.  4. Hyperlipidemia - Continue statin. Goal LDL less than 70.   Disposition: He is due to be released from prison sometime in the next 60 days. He can certainly follow up here with Dr. Katrinka Blazing in 3 months. He is originally from [place]. If he goes back to [facility], he should establish with cardiology there.  Medication Adjustments/Labs and Tests Ordered: Current medicines are reviewed at length with the patient today.  Concerns regarding medicines are outlined above.  Medication changes, Labs and Tests ordered today are outlined in the Patient  Instructions noted below. Patient Instructions  Medication Instructions:  1. INCREASE HYDRALAZINE TO 75 MG THREE TIMES A DAY Labwork: NONE Testing/Procedures: NONE Follow-Up: DR. Katrinka Blazing IN 3 MONTHS  Any Other Special Instructions Will Be Listed Below (If Applicable). IF YOU  DO RETURN TO [place] AFTER RELEASE YOU WILL NEED TO ESTABLISH WITH A CARDIOLOGIST THERE.  If you need a refill on your cardiac medications before your next appointment, please call your pharmacy.   Signed, Tereso Newcomer, PA-C  12/26/2015 12:02 PM    [facility] [address] [place], [place], [place]  [address] Phone: [phone]; Fax: [phone]

## 2016-03-10 DIAGNOSIS — I10 Essential (primary) hypertension: Secondary | ICD-10-CM | POA: Diagnosis not present

## 2016-03-10 DIAGNOSIS — Z86711 Personal history of pulmonary embolism: Secondary | ICD-10-CM

## 2016-03-10 DIAGNOSIS — E875 Hyperkalemia: Secondary | ICD-10-CM

## 2016-03-10 DIAGNOSIS — I2510 Atherosclerotic heart disease of native coronary artery without angina pectoris: Secondary | ICD-10-CM

## 2016-03-10 DIAGNOSIS — E1165 Type 2 diabetes mellitus with hyperglycemia: Secondary | ICD-10-CM

## 2016-03-11 DIAGNOSIS — E875 Hyperkalemia: Secondary | ICD-10-CM | POA: Diagnosis not present

## 2016-03-11 DIAGNOSIS — I10 Essential (primary) hypertension: Secondary | ICD-10-CM | POA: Diagnosis not present

## 2016-03-11 DIAGNOSIS — E1165 Type 2 diabetes mellitus with hyperglycemia: Secondary | ICD-10-CM | POA: Diagnosis not present

## 2016-03-11 DIAGNOSIS — I2510 Atherosclerotic heart disease of native coronary artery without angina pectoris: Secondary | ICD-10-CM | POA: Diagnosis not present

## 2016-03-12 DIAGNOSIS — E875 Hyperkalemia: Secondary | ICD-10-CM | POA: Diagnosis not present

## 2016-03-12 DIAGNOSIS — E1165 Type 2 diabetes mellitus with hyperglycemia: Secondary | ICD-10-CM | POA: Diagnosis not present

## 2016-03-12 DIAGNOSIS — I2510 Atherosclerotic heart disease of native coronary artery without angina pectoris: Secondary | ICD-10-CM | POA: Diagnosis not present

## 2016-03-12 DIAGNOSIS — I10 Essential (primary) hypertension: Secondary | ICD-10-CM | POA: Diagnosis not present

## 2016-03-20 ENCOUNTER — Encounter (HOSPITAL_COMMUNITY): Payer: Self-pay

## 2016-03-20 ENCOUNTER — Emergency Department (HOSPITAL_COMMUNITY): Payer: Medicaid Other

## 2016-03-20 ENCOUNTER — Inpatient Hospital Stay (HOSPITAL_COMMUNITY)
Admission: EM | Admit: 2016-03-20 | Discharge: 2016-03-28 | DRG: 305 | Disposition: A | Discharge status: Home / Self Care | Payer: Medicaid Other | Attending: Internal Medicine | Admitting: Internal Medicine

## 2016-03-20 DIAGNOSIS — Z7982 Long term (current) use of aspirin: Secondary | ICD-10-CM

## 2016-03-20 DIAGNOSIS — Z87891 Personal history of nicotine dependence: Secondary | ICD-10-CM

## 2016-03-20 DIAGNOSIS — Z794 Long term (current) use of insulin: Secondary | ICD-10-CM | POA: Diagnosis not present

## 2016-03-20 DIAGNOSIS — I2510 Atherosclerotic heart disease of native coronary artery without angina pectoris: Secondary | ICD-10-CM | POA: Diagnosis present

## 2016-03-20 DIAGNOSIS — E785 Hyperlipidemia, unspecified: Secondary | ICD-10-CM | POA: Diagnosis not present

## 2016-03-20 DIAGNOSIS — Z951 Presence of aortocoronary bypass graft: Secondary | ICD-10-CM

## 2016-03-20 DIAGNOSIS — Z8249 Family history of ischemic heart disease and other diseases of the circulatory system: Secondary | ICD-10-CM

## 2016-03-20 DIAGNOSIS — E1165 Type 2 diabetes mellitus with hyperglycemia: Secondary | ICD-10-CM | POA: Diagnosis present

## 2016-03-20 DIAGNOSIS — Z79899 Other long term (current) drug therapy: Secondary | ICD-10-CM

## 2016-03-20 DIAGNOSIS — R51 Headache: Secondary | ICD-10-CM | POA: Diagnosis present

## 2016-03-20 DIAGNOSIS — I160 Hypertensive urgency: Secondary | ICD-10-CM | POA: Diagnosis not present

## 2016-03-20 DIAGNOSIS — Z833 Family history of diabetes mellitus: Secondary | ICD-10-CM | POA: Diagnosis not present

## 2016-03-20 DIAGNOSIS — E7800 Pure hypercholesterolemia, unspecified: Secondary | ICD-10-CM | POA: Diagnosis present

## 2016-03-20 DIAGNOSIS — I252 Old myocardial infarction: Secondary | ICD-10-CM | POA: Diagnosis not present

## 2016-03-20 DIAGNOSIS — Z86711 Personal history of pulmonary embolism: Secondary | ICD-10-CM | POA: Diagnosis not present

## 2016-03-20 DIAGNOSIS — I161 Hypertensive emergency: Secondary | ICD-10-CM | POA: Diagnosis present

## 2016-03-20 DIAGNOSIS — Z23 Encounter for immunization: Secondary | ICD-10-CM

## 2016-03-20 DIAGNOSIS — I25118 Atherosclerotic heart disease of native coronary artery with other forms of angina pectoris: Secondary | ICD-10-CM | POA: Diagnosis not present

## 2016-03-20 DIAGNOSIS — R0789 Other chest pain: Secondary | ICD-10-CM | POA: Diagnosis not present

## 2016-03-20 DIAGNOSIS — T448X5A Adverse effect of centrally-acting and adrenergic-neuron-blocking agents, initial encounter: Secondary | ICD-10-CM | POA: Diagnosis not present

## 2016-03-20 DIAGNOSIS — E118 Type 2 diabetes mellitus with unspecified complications: Secondary | ICD-10-CM

## 2016-03-20 DIAGNOSIS — R079 Chest pain, unspecified: Secondary | ICD-10-CM | POA: Diagnosis present

## 2016-03-20 DIAGNOSIS — R0602 Shortness of breath: Secondary | ICD-10-CM | POA: Diagnosis present

## 2016-03-20 DIAGNOSIS — Z823 Family history of stroke: Secondary | ICD-10-CM

## 2016-03-20 DIAGNOSIS — R001 Bradycardia, unspecified: Secondary | ICD-10-CM | POA: Diagnosis present

## 2016-03-20 DIAGNOSIS — I248 Other forms of acute ischemic heart disease: Secondary | ICD-10-CM | POA: Diagnosis present

## 2016-03-20 DIAGNOSIS — K219 Gastro-esophageal reflux disease without esophagitis: Secondary | ICD-10-CM | POA: Diagnosis present

## 2016-03-20 DIAGNOSIS — IMO0002 Reserved for concepts with insufficient information to code with codable children: Secondary | ICD-10-CM | POA: Diagnosis present

## 2016-03-20 HISTORY — DX: Hyperlipidemia, unspecified: E78.5

## 2016-03-20 HISTORY — DX: Essential (primary) hypertension: I10

## 2016-03-20 LAB — BASIC METABOLIC PANEL
ANION GAP: 9 (ref 5–15)
BUN: 7 mg/dL (ref 6–20)
CHLORIDE: 103 mmol/L (ref 101–111)
CO2: 24 mmol/L (ref 22–32)
Calcium: 9.3 mg/dL (ref 8.9–10.3)
Creatinine, Ser: 1.14 mg/dL (ref 0.61–1.24)
GFR calc non Af Amer: >60 mL/min (ref >60)
Glucose, Bld: 180 mg/dL — ABNORMAL HIGH (ref 65–99)
POTASSIUM: 4 mmol/L (ref 3.5–5.1)
SODIUM: 136 mmol/L (ref 135–145)

## 2016-03-20 LAB — CBC
HEMATOCRIT: 46.4 % (ref 39.0–52.0)
Hemoglobin: 15.2 g/dL (ref 13.0–17.0)
MCH: 28 pg (ref 26.0–34.0)
MCHC: 32.8 g/dL (ref 30.0–36.0)
MCV: 85.6 fL (ref 78.0–100.0)
Platelets: 172 10*3/uL (ref 150–400)
RBC: 5.42 MIL/uL (ref 4.22–5.81)
RDW: 14.4 % (ref 11.5–15.5)
WBC: 6.3 10*3/uL (ref 4.0–10.5)

## 2016-03-20 LAB — BRAIN NATRIURETIC PEPTIDE: B Natriuretic Peptide: 21.7 pg/mL (ref 0.0–100.0)

## 2016-03-20 LAB — I-STAT TROPONIN, ED: Troponin i, poc: 0 ng/mL (ref 0.00–0.08)

## 2016-03-20 LAB — MAGNESIUM: MAGNESIUM: 1.9 mg/dL (ref 1.7–2.4)

## 2016-03-20 MED ORDER — FUROSEMIDE 40 MG PO TABS
40.0000 mg | ORAL_TABLET | Freq: Every day | ORAL | Status: DC
  Administered 2016-03-21 – 2016-03-23 (×3): 40 mg via ORAL
  Filled 2016-03-20: qty 1
  Filled 2016-03-20 (×3): qty 2

## 2016-03-20 MED ORDER — ONDANSETRON HCL 4 MG/2ML IJ SOLN: 4.0000 mg | Freq: Four times a day (QID) | INTRAMUSCULAR | Status: DC | PRN

## 2016-03-20 MED ORDER — CLONIDINE HCL 0.2 MG PO TABS
0.3000 mg | ORAL_TABLET | Freq: Once | ORAL | Status: AC
  Administered 2016-03-20: 0.3 mg via ORAL
  Filled 2016-03-20: qty 1

## 2016-03-20 MED ORDER — RANOLAZINE ER 500 MG PO TB12
1000.0000 mg | ORAL_TABLET | Freq: Two times a day (BID) | ORAL | Status: DC
  Administered 2016-03-21 – 2016-03-28 (×16): 1000 mg via ORAL
  Filled 2016-03-20 (×16): qty 2

## 2016-03-20 MED ORDER — ALPRAZOLAM 0.25 MG PO TABS
0.2500 mg | ORAL_TABLET | Freq: Two times a day (BID) | ORAL | Status: DC | PRN
  Administered 2016-03-21 – 2016-03-26 (×5): 0.25 mg via ORAL
  Filled 2016-03-20 (×5): qty 1

## 2016-03-20 MED ORDER — INSULIN ASPART 100 UNIT/ML ~~LOC~~ SOLN
0.0000 [IU] | Freq: Three times a day (TID) | SUBCUTANEOUS | Status: DC
  Administered 2016-03-21 – 2016-03-22 (×4): 2 [IU] via SUBCUTANEOUS
  Administered 2016-03-23 – 2016-03-24 (×2): 1 [IU] via SUBCUTANEOUS
  Administered 2016-03-24: 3 [IU] via SUBCUTANEOUS
  Administered 2016-03-25 – 2016-03-27 (×5): 2 [IU] via SUBCUTANEOUS
  Administered 2016-03-28: 1 [IU] via SUBCUTANEOUS

## 2016-03-20 MED ORDER — NITROGLYCERIN IN D5W 200-5 MCG/ML-% IV SOLN
2.0000 ug/min | INTRAVENOUS | Status: DC
  Administered 2016-03-21: 5 ug/min via INTRAVENOUS
  Administered 2016-03-22 – 2016-03-25 (×3): 15 ug/min via INTRAVENOUS
  Filled 2016-03-20 (×4): qty 250

## 2016-03-20 MED ORDER — ZOLPIDEM TARTRATE 5 MG PO TABS
5.0000 mg | ORAL_TABLET | Freq: Every evening | ORAL | Status: DC | PRN
  Administered 2016-03-23 – 2016-03-27 (×7): 5 mg via ORAL
  Filled 2016-03-20 (×7): qty 1

## 2016-03-20 MED ORDER — LISINOPRIL 40 MG PO TABS
40.0000 mg | ORAL_TABLET | Freq: Every morning | ORAL | Status: DC
  Administered 2016-03-21 – 2016-03-28 (×8): 40 mg via ORAL
  Filled 2016-03-20 (×2): qty 2
  Filled 2016-03-20: qty 1
  Filled 2016-03-20 (×5): qty 2

## 2016-03-20 MED ORDER — HYDRALAZINE HCL 50 MG PO TABS
100.0000 mg | ORAL_TABLET | Freq: Three times a day (TID) | ORAL | Status: DC
  Administered 2016-03-21 – 2016-03-28 (×23): 100 mg via ORAL
  Filled 2016-03-20 (×23): qty 2

## 2016-03-20 MED ORDER — PANTOPRAZOLE SODIUM 20 MG PO TBEC
20.0000 mg | DELAYED_RELEASE_TABLET | Freq: Every morning | ORAL | Status: DC
  Administered 2016-03-21 – 2016-03-28 (×8): 20 mg via ORAL
  Filled 2016-03-20 (×8): qty 1

## 2016-03-20 MED ORDER — NITROGLYCERIN 2 % TD OINT
1.0000 [in_us] | TOPICAL_OINTMENT | Freq: Once | TRANSDERMAL | Status: AC
  Administered 2016-03-20: 1 [in_us] via TOPICAL
  Filled 2016-03-20: qty 1

## 2016-03-20 MED ORDER — ENOXAPARIN SODIUM 40 MG/0.4ML ~~LOC~~ SOLN
40.0000 mg | Freq: Every day | SUBCUTANEOUS | Status: DC
  Administered 2016-03-21 – 2016-03-27 (×8): 40 mg via SUBCUTANEOUS
  Filled 2016-03-20 (×8): qty 0.4

## 2016-03-20 MED ORDER — INSULIN GLARGINE 100 UNIT/ML ~~LOC~~ SOLN
24.0000 [IU] | Freq: Every day | SUBCUTANEOUS | Status: DC
  Administered 2016-03-21 – 2016-03-27 (×8): 24 [IU] via SUBCUTANEOUS
  Filled 2016-03-20 (×9): qty 0.24

## 2016-03-20 MED ORDER — ISOSORBIDE MONONITRATE ER 30 MG PO TB24
30.0000 mg | ORAL_TABLET | Freq: Every day | ORAL | Status: DC
  Administered 2016-03-21: 30 mg via ORAL
  Filled 2016-03-20: qty 1

## 2016-03-20 MED ORDER — LABETALOL HCL 5 MG/ML IV SOLN
0.5000 mg/min | INTRAVENOUS | Status: DC
  Filled 2016-03-20: qty 100

## 2016-03-20 MED ORDER — ASPIRIN 81 MG PO CHEW
81.0000 mg | CHEWABLE_TABLET | Freq: Every morning | ORAL | Status: DC
  Administered 2016-03-21 – 2016-03-28 (×8): 81 mg via ORAL
  Filled 2016-03-20 (×8): qty 1

## 2016-03-20 MED ORDER — PRAVASTATIN SODIUM 40 MG PO TABS
40.0000 mg | ORAL_TABLET | Freq: Every day | ORAL | Status: DC
Start: 2016-03-21 — End: 2016-03-28
  Administered 2016-03-21 – 2016-03-27 (×8): 40 mg via ORAL
  Filled 2016-03-20 (×8): qty 1

## 2016-03-20 MED ORDER — GABAPENTIN 300 MG PO CAPS
300.0000 mg | ORAL_CAPSULE | Freq: Three times a day (TID) | ORAL | Status: DC
  Administered 2016-03-21 – 2016-03-24 (×13): 300 mg via ORAL
  Filled 2016-03-20 (×13): qty 1

## 2016-03-20 MED ORDER — CARVEDILOL 12.5 MG PO TABS
25.0000 mg | ORAL_TABLET | Freq: Two times a day (BID) | ORAL | Status: DC
  Administered 2016-03-21: 25 mg via ORAL
  Filled 2016-03-20: qty 2

## 2016-03-20 MED ORDER — ACETAMINOPHEN 325 MG PO TABS
650.0000 mg | ORAL_TABLET | ORAL | Status: DC | PRN
  Administered 2016-03-21 (×2): 650 mg via ORAL
  Filled 2016-03-20 (×2): qty 2

## 2016-03-20 MED ORDER — AMLODIPINE BESYLATE 10 MG PO TABS
10.0000 mg | ORAL_TABLET | Freq: Every morning | ORAL | Status: DC
  Administered 2016-03-21 – 2016-03-28 (×8): 10 mg via ORAL
  Filled 2016-03-20 (×4): qty 2
  Filled 2016-03-20: qty 1
  Filled 2016-03-20 (×3): qty 2

## 2016-03-20 MED ORDER — CLONIDINE HCL 0.2 MG PO TABS
0.3000 mg | ORAL_TABLET | Freq: Four times a day (QID) | ORAL | Status: DC
  Filled 2016-03-20: qty 1

## 2016-03-20 MED ORDER — SPIRONOLACTONE 25 MG PO TABS
50.0000 mg | ORAL_TABLET | Freq: Every day | ORAL | Status: DC
  Administered 2016-03-21 – 2016-03-28 (×8): 50 mg via ORAL
  Filled 2016-03-20 (×8): qty 2

## 2016-03-20 NOTE — ED Triage Notes (Signed)
Per EMS - pt c/o left-sided CP radiating down left arm starting yesterday around 1000. Hx chronic CP. Last BP 230/160. Hx HTN, took BP meds today. C/o HA.

## 2016-03-20 NOTE — ED Notes (Signed)
IV team at bedside 

## 2016-03-20 NOTE — ED Provider Notes (Signed)
[facility]-EMERGENCY DEPT Provider Note   CSN: [id number] Arrival date & time: 03/20/16  1845     History   Chief Complaint Chief Complaint  Patient presents with  . Chest Pain    HPI Raymond Crane is a 55 y.o. male.  The history is provided by the patient.  Chest Pain   This is a new problem. The current episode started 12 to 24 hours ago. The problem occurs constantly. The problem has not changed since onset.The pain is associated with rest. The pain is present in the substernal region. The pain is moderate. The quality of the pain is described as pressure-like. The pain radiates to the left neck and left arm. Duration of episode(s) is 2 days. Associated symptoms include headaches and shortness of breath. Pertinent negatives include no abdominal pain, no back pain, no cough, no diaphoresis, no dizziness, no fever, no lower extremity edema, no nausea, no vomiting and no weakness. He has tried rest and nitroglycerin (and aspirin) for the symptoms. The treatment provided no relief.  His past medical history is significant for CAD and hypertension.    Past Medical History:  Diagnosis Date  . GERD (gastroesophageal reflux disease)   . High cholesterol   . Hypertension   . Myocardial infarction 2013  . Non-obstructive CAD    a. 10/2011 NSTEMI/Cath: nonobs dzs, EF nl-->Med Rx; b. 10/2015 MV: apical/anterior/apical reversible defect->low risk->Med rx; c. 11/2015 Cath: LM nl, LAD nl, D1 70ost, LCX 40, OM1 40, Om2 40, RCA 33m, EF 55-65%.  . Pulmonary embolism (HCC) 2014  . Uncontrolled type II diabetes mellitus (HCC)    [name] 11/08/2015    Patient Active Problem List   Diagnosis Date Noted  . HLD (hyperlipidemia) 03/20/2016  . GERD (gastroesophageal reflux disease) 03/20/2016  . Chest pain 03/20/2016  . Chest pain with high risk for cardiac etiology 11/23/2015  . Chest pain at rest 11/22/2015  . Abnormal EKG-inferior TWI 11/10/2015  . CAD - minor CAD at cath Jan 2013 11/09/2015  .  Hypertensive heart disease without CHF   . Cephalalgia   . Hypertensive urgency 11/08/2015  . Type 2 diabetes mellitus, uncontrolled (HCC) 11/08/2015  . History of pulmonary embolism 2013 11/08/2015  . History of MI (myocardial infarction) 11/08/2015  . Chest pain with moderate risk of acute coronary syndrome 11/08/2015    Past Surgical History:  Procedure Laterality Date  . CARDIAC CATHETERIZATION  2013   at [facility]   . CARDIAC CATHETERIZATION N/A 11/23/2015   Procedure: Left Heart Cath and Coronary Angiography;  Surgeon: Lyn Records, MD;  Location: [facility] INVASIVE CV LAB;  Service: Cardiovascular;  Laterality: N/A;  . INGUINAL HERNIA REPAIR Right        Home Medications    Prior to Admission medications   Medication Sig Start Date End Date Taking? Authorizing Provider  amLODipine (NORVASC) 10 MG tablet Take 10 mg by mouth every morning.   Yes Historical Provider, MD  aspirin 81 MG chewable tablet Chew 81 mg by mouth every morning.   Yes Historical Provider, MD  carvedilol (COREG) 25 MG tablet Take 25 mg by mouth every 12 (twelve) hours.   Yes Historical Provider, MD  cloNIDine (CATAPRES) 0.3 MG tablet Take 0.3 mg by mouth every 6 (six) hours. HOLD IF PULSE  <60   Yes Historical Provider, MD  furosemide (LASIX) 40 MG tablet Take 1 tablet (40 mg total) by mouth daily. 11/13/15  Yes Tasrif Ahmed, MD  gabapentin (NEURONTIN) 300 MG capsule Take  300 mg by mouth 3 (three) times daily.   Yes Historical Provider, MD  hydrALAZINE (APRESOLINE) 100 MG tablet Take 100 mg by mouth 3 (three) times daily.   Yes Historical Provider, MD  insulin glargine (LANTUS) 100 UNIT/ML injection Inject 35 Units into the skin at bedtime.   Yes Historical Provider, MD  insulin regular (HUMULIN R) 100 units/mL injection Inject 2-12 Units into the skin 3 (three) times daily before meals. Per sliding scale: 200-249 = 2 units; 250-299 = 4 units; 300-349 = 6 units; 350-399 = 8 units; 400-499 = 10 units;  500-549 = 12 units; call provider if >550   Yes Historical Provider, MD  isosorbide mononitrate (IMDUR) 30 MG 24 hr tablet Take 1 tablet (30 mg total) by mouth daily. 11/13/15  Yes Tasrif Ahmed, MD  lisinopril (PRINIVIL,ZESTRIL) 40 MG tablet Take 40 mg by mouth every morning.  09/23/15  Yes Historical Provider, MD  metFORMIN (GLUCOPHAGE) 1000 MG tablet Take 1 tablet (1,000 mg total) by mouth 2 (two) times daily with a meal. 11/24/15  Yes Ok Anis, NP  nitroGLYCERIN (NITROSTAT) 0.4 MG SL tablet Place 1 tablet (0.4 mg total) under the tongue every 5 (five) minutes x 3 doses as needed for chest pain. 11/24/15  Yes Ok Anis, NP  pantoprazole (PROTONIX) 20 MG tablet Take 20 mg by mouth every morning.   Yes Historical Provider, MD  pravastatin (PRAVACHOL) 40 MG tablet Take 40 mg by mouth at bedtime.   Yes Historical Provider, MD  ranolazine (RANEXA) 500 MG 12 hr tablet Take 1,000 mg by mouth every 12 (twelve) hours.   Yes Historical Provider, MD  spironolactone (ALDACTONE) 50 MG tablet Take 1 tablet (50 mg total) by mouth daily. 11/24/15  Yes Ok Anis, NP  hydrALAZINE (APRESOLINE) 50 MG tablet Take 1.5 tablets (75 mg total) by mouth every 8 (eight) hours. Patient not taking: Reported on 03/20/2016 12/26/15   Beatrice Lecher, PA-C    Family History Family History  Problem Relation Age of Onset  . Heart disease Mother   . Stroke Mother   . Diabetes Sister   . Diabetes Brother   . Hypertension Sister   . Hypertension Brother     Social History Social History  Substance Use Topics  . Smoking status: Former Smoker    Packs/day: 1.00    Years: 25.00    Types: Cigarettes    Quit date: 12/17/2014  . Smokeless tobacco: Never Used  . Alcohol use Yes     Comment: 11/09/2015 "nothing since July 2016"     Allergies   Review of patient's allergies indicates no known allergies.   Review of Systems Review of Systems  Constitutional: Negative for diaphoresis and fever.  HENT:  Negative for congestion.   Eyes: Positive for visual disturbance.       Intermittent blurring of vision b/l with the global headaches and HTN  Respiratory: Positive for chest tightness and shortness of breath. Negative for cough.   Cardiovascular: Positive for chest pain. Negative for leg swelling.  Gastrointestinal: Negative for abdominal pain, nausea and vomiting.  Genitourinary: Negative for flank pain.  Musculoskeletal: Negative for back pain.  Skin: Negative for rash.  Neurological: Positive for headaches. Negative for dizziness and weakness.  Psychiatric/Behavioral: Negative for confusion.     Physical Exam Updated Vital Signs BP (!) 212/144   Pulse 76   Temp 97.5 F (36.4 C) (Oral)   Resp 19   SpO2 96%   Physical Exam  Constitutional: He is oriented to person, place, and time. He appears well-developed and well-nourished. No distress.  Pleasant, cooperative, non-toxic appearing  HENT:  Head: Normocephalic and atraumatic.  Eyes: Conjunctivae and EOM are normal. Pupils are equal, round, and reactive to light. No scleral icterus.  Neck: Normal range of motion. Neck supple. No JVD present.  Cardiovascular: Normal rate and regular rhythm.   Pulmonary/Chest: Effort normal and breath sounds normal. No respiratory distress.  Abdominal: Soft. He exhibits no distension. There is no tenderness.  Musculoskeletal: He exhibits no edema or tenderness.  Symmetric size and appearance of calves b/l. No calf swelling or tenderness  Neurological: He is alert and oriented to person, place, and time. No cranial nerve deficit. He exhibits normal muscle tone. Coordination normal.  Symmetric strength of b/l Ue's and b/l Le's. Intact sensation throughout.  Skin: Skin is warm and dry. Capillary refill takes less than 2 seconds. No rash noted. He is not diaphoretic.  Psychiatric: He has a normal mood and affect.  Nursing note and vitals reviewed.    ED Treatments / Results  Labs (all labs  ordered are listed, but only abnormal results are displayed) Labs Reviewed  BASIC METABOLIC PANEL - Abnormal; Notable for the following:       Result Value   Glucose, Bld 180 (*)    All other components within normal limits  GLUCOSE, CAPILLARY - Abnormal; Notable for the following:    Glucose-Capillary 150 (*)    All other components within normal limits  MRSA PCR SCREENING  CBC  MAGNESIUM  BRAIN NATRIURETIC PEPTIDE  TROPONIN I  URINE RAPID DRUG SCREEN, HOSP PERFORMED  HEMOGLOBIN A1C  LIPID PANEL  TROPONIN I  TROPONIN I  I-STAT TROPOININ, ED    EKG  EKG Interpretation None       Radiology Dg Chest 2 View  Result Date: 03/20/2016 CLINICAL DATA:  55 year old male with chest pain EXAM: CHEST  2 VIEW COMPARISON:  Chest radiograph dated 03/09/2016 FINDINGS: The heart size and mediastinal contours are within normal limits. Both lungs are clear. The visualized skeletal structures are unremarkable. IMPRESSION: No active cardiopulmonary disease. Electronically Signed   By: Elgie CollardArash  Radparvar M.D.   On: 03/20/2016 22:20    Procedures Procedures (including critical care time)  Medications Ordered in ED Medications  nitroGLYCERIN 50 mg in dextrose 5 % 250 mL (0.2 mg/mL) infusion (5 mcg/min Intravenous New Bag/Given 03/21/16 0050)  hydrALAZINE (APRESOLINE) tablet 100 mg (100 mg Oral Given 03/21/16 0030)  carvedilol (COREG) tablet 25 mg (not administered)  spironolactone (ALDACTONE) tablet 50 mg (not administered)  amLODipine (NORVASC) tablet 10 mg (not administered)  aspirin chewable tablet 81 mg (not administered)  furosemide (LASIX) tablet 40 mg (not administered)  gabapentin (NEURONTIN) capsule 300 mg (300 mg Oral Given 03/21/16 0030)  insulin glargine (LANTUS) injection 24 Units (24 Units Subcutaneous Given 03/21/16 0050)  isosorbide mononitrate (IMDUR) 24 hr tablet 30 mg (not administered)  lisinopril (PRINIVIL,ZESTRIL) tablet 40 mg (not administered)  pantoprazole (PROTONIX) EC  tablet 20 mg (not administered)  pravastatin (PRAVACHOL) tablet 40 mg (40 mg Oral Given 03/21/16 0030)  ranolazine (RANEXA) 12 hr tablet 1,000 mg (1,000 mg Oral Given 03/21/16 0030)  insulin aspart (novoLOG) injection 0-9 Units (not administered)  acetaminophen (TYLENOL) tablet 650 mg (650 mg Oral Given 03/21/16 0149)  ondansetron (ZOFRAN) injection 4 mg (not administered)  enoxaparin (LOVENOX) injection 40 mg (40 mg Subcutaneous Given 03/21/16 0030)  zolpidem (AMBIEN) tablet 5 mg (not administered)  ALPRAZolam (XANAX) tablet 0.25  mg (not administered)  morphine 2 MG/ML injection 2 mg (2 mg Intravenous Given 03/21/16 0026)  cloNIDine (CATAPRES) tablet 0.3 mg (not administered)  Influenza vac split quadrivalent PF (FLUARIX) injection 0.5 mL (not administered)  cloNIDine (CATAPRES) tablet 0.3 mg (0.3 mg Oral Given 03/20/16 2201)  nitroGLYCERIN (NITROGLYN) 2 % ointment 1 inch (1 inch Topical Given 03/20/16 2201)     Initial Impression / Assessment and Plan / ED Course  I have reviewed the triage vital signs and the nursing notes.  Pertinent labs & imaging results that were available during my care of the patient were reviewed by me and considered in my medical decision making (see chart for details).  Clinical Course   Lenton Gendreau is a 55 y.o. male who presents to ED via EMS from clinic in [place] for HTN emergency with associated CP radiating down LUE, global headaches, blurring of vision, and mild dyspnea x 2 days. Pt is an inmate and receives his daily BP medications as Rx provided by the facility he resides. Pt is on numerous BP medications as listed above and compliant. Given full aspirin and 3 SL NTG en route by EMS without relief of BP or Cp. Given NTG paste in ED and additional clonidine without relief. Admitted for further management of malignant HTN with hypertensive emergency.   Pt condition, course, and admission were discussed with attending physician Dr. Margarita Grizzle.   Final  Clinical Impressions(s) / ED Diagnoses   Final diagnoses:  Hypertensive emergency    New Prescriptions Current Discharge Medication List       Horald Pollen, MD 03/21/16 1610    Margarita Grizzle, MD 03/21/16 1551

## 2016-03-20 NOTE — H&P (Signed)
History and Physical    Raymond Crane ZOX:[id number] DOB: 27-Sep-1960 DOA: 03/20/2016  Referring MD/NP/PA:   PCP: Berna Bue, MD   Patient coming from:  [facility].  At baseline, pt is independent for most of ADL.  Chief Complaint: Chest pain, elevated blood pressure  HPI: Raymond Crane is a 55 y.o. male with medical history significant of hypertension, hyperlipidemia, diabetes mellitus, GERD, PE 2013, CABG, myocardial infarction, who presents with chest pain and elevated blood pressure.  Pt states that his chest pain started yesterday morning. His chest is located in the left septal chest, constant, 8 out of 10 in severity, pressure-like, radiating to the left arm. It is not aggravated or alleviated by any known factors. He was found to have elevated blood pressure of SBP 230 per EMS. He states that he is taking 7 different types of blood pressure medications, and has been compliant to medications. He states that he took all his medications today. Patient also complains of headache over occipital area, which is constant, 8 out of 10 in severity, nonradiating. No unilateral weakness, numbness or tingling in extremities. No vision change or hearing loss. Patient does not have nausea, vomiting, diarrhea, abdominal pain, symptoms of UTI. No fever, chills, cough. Pt was given one dose of clonidine, nitroglycerin patch in ED. Labetalol drip was ordered, but not started yet in ED. His SBP is still 210 when I saw pt in ED.  ED Course: pt was found to have negative troponin, BNP 21.7, WBC 6.3, creatinine 1.14, temperature normal, bradycardia. Negative chest x-ray. Patient is admitted to stepdown as inpatient.  Review of Systems:   General: no fevers, chills, no changes in body weight, has fatigue and HA HEENT: no blurry vision, hearing changes or sore throat Respiratory: no dyspnea, coughing, wheezing CV: has chest pain, no palpitations GI: no nausea, vomiting, abdominal pain,  diarrhea, constipation GU: no dysuria, burning on urination, increased urinary frequency, hematuria  Ext: has mild leg edema Neuro: no unilateral weakness, numbness, or tingling, no vision change or hearing loss Skin: no rash, no skin tear. MSK: No muscle spasm, no deformity, no limitation of range of movement in spin Heme: No easy bruising.  Travel history: No recent long distant travel.  Allergy: No Known Allergies  Past Medical History:  Diagnosis Date  . GERD (gastroesophageal reflux disease)   . High cholesterol   . Hypertension   . Myocardial infarction 2013  . Non-obstructive CAD    a. 10/2011 NSTEMI/Cath: nonobs dzs, EF nl-->Med Rx; b. 10/2015 MV: apical/anterior/apical reversible defect->low risk->Med rx; c. 11/2015 Cath: LM nl, LAD nl, D1 70ost, LCX 40, OM1 40, Om2 40, RCA 18m, EF 55-65%.  . Pulmonary embolism (HCC) 2014  . Uncontrolled type II diabetes mellitus (HCC)    [name] 11/08/2015    Past Surgical History:  Procedure Laterality Date  . CARDIAC CATHETERIZATION  2013   at [facility]   . CARDIAC CATHETERIZATION N/A 11/23/2015   Procedure: Left Heart Cath and Coronary Angiography;  Surgeon: Lyn Records, MD;  Location: [facility] INVASIVE CV LAB;  Service: Cardiovascular;  Laterality: N/A;  . INGUINAL HERNIA REPAIR Right     Social History:  reports that he quit smoking about 15 months ago. His smoking use included Cigarettes. He has a 25.00 pack-year smoking history. He has never used smokeless tobacco. He reports that he drinks alcohol. He reports that he does not use drugs.  Family History:  Family History  Problem Relation Age of  Onset  . Heart disease Mother   . Stroke Mother   . Diabetes Sister   . Diabetes Brother   . Hypertension Sister   . Hypertension Brother      Prior to Admission medications   Medication Sig Start Date End Date Taking? Authorizing Provider  amLODipine (NORVASC) 10 MG tablet Take 10 mg by mouth every morning.   Yes  Historical Provider, MD  aspirin 81 MG chewable tablet Chew 81 mg by mouth every morning.   Yes Historical Provider, MD  carvedilol (COREG) 25 MG tablet Take 25 mg by mouth every 12 (twelve) hours.   Yes Historical Provider, MD  cloNIDine (CATAPRES) 0.3 MG tablet Take 0.3 mg by mouth every 6 (six) hours. HOLD IF PULSE  <60   Yes Historical Provider, MD  furosemide (LASIX) 40 MG tablet Take 1 tablet (40 mg total) by mouth daily. 11/13/15  Yes Tasrif Ahmed, MD  gabapentin (NEURONTIN) 300 MG capsule Take 300 mg by mouth 3 (three) times daily.   Yes Historical Provider, MD  hydrALAZINE (APRESOLINE) 100 MG tablet Take 100 mg by mouth 3 (three) times daily.   Yes Historical Provider, MD  insulin glargine (LANTUS) 100 UNIT/ML injection Inject 35 Units into the skin at bedtime.   Yes Historical Provider, MD  insulin regular (HUMULIN R) 100 units/mL injection Inject 2-12 Units into the skin 3 (three) times daily before meals. Per sliding scale: 200-249 = 2 units; 250-299 = 4 units; 300-349 = 6 units; 350-399 = 8 units; 400-499 = 10 units; 500-549 = 12 units; call provider if >550   Yes Historical Provider, MD  isosorbide mononitrate (IMDUR) 30 MG 24 hr tablet Take 1 tablet (30 mg total) by mouth daily. 11/13/15  Yes Tasrif Ahmed, MD  lisinopril (PRINIVIL,ZESTRIL) 40 MG tablet Take 40 mg by mouth every morning.  09/23/15  Yes Historical Provider, MD  metFORMIN (GLUCOPHAGE) 1000 MG tablet Take 1 tablet (1,000 mg total) by mouth 2 (two) times daily with a meal. 11/24/15  Yes Ok Anishristopher R Berge, NP  nitroGLYCERIN (NITROSTAT) 0.4 MG SL tablet Place 1 tablet (0.4 mg total) under the tongue every 5 (five) minutes x 3 doses as needed for chest pain. 11/24/15  Yes Ok Anishristopher R Berge, NP  pantoprazole (PROTONIX) 20 MG tablet Take 20 mg by mouth every morning.   Yes Historical Provider, MD  pravastatin (PRAVACHOL) 40 MG tablet Take 40 mg by mouth at bedtime.   Yes Historical Provider, MD  ranolazine (RANEXA) 500 MG 12 hr  tablet Take 1,000 mg by mouth every 12 (twelve) hours.   Yes Historical Provider, MD  spironolactone (ALDACTONE) 50 MG tablet Take 1 tablet (50 mg total) by mouth daily. 11/24/15  Yes Ok Anishristopher R Berge, NP  hydrALAZINE (APRESOLINE) 50 MG tablet Take 1.5 tablets (75 mg total) by mouth every 8 (eight) hours. Patient not taking: Reported on 03/20/2016 12/26/15   Beatrice LecherScott T Weaver, PA-C    Physical Exam: Vitals:   03/20/16 2245 03/20/16 2300 03/20/16 2315 03/20/16 2330  BP: (!) 221/146 (!) 220/143 (!) 176/108 (!) 210/135  Pulse: 65 82 (!) 58 76  Resp: 16 16 12 17   Temp:      TempSrc:      SpO2: 94% 98% 96% 95%   General: Not in acute distress HEENT:       Eyes: PERRL, EOMI, no scleral icterus.       ENT: No discharge from the ears and nose, no pharynx injection, no tonsillar enlargement.  Neck: No JVD, no bruit, no mass felt. Heme: No neck lymph node enlargement. Cardiac: S1/S2, RRR, No murmurs, No gallops or rubs. Respiratory: No rales, wheezing, rhonchi or rubs. GI: Soft, nondistended, nontender, no rebound pain, no organomegaly, BS present. GU: No hematuria Ext: has trace leg edema bilaterally. 2+DP/PT pulse bilaterally. Musculoskeletal: No joint deformities, No joint redness or warmth, no limitation of ROM in spin. Skin: No rashes.  Neuro: Alert, oriented X3, cranial nerves II-XII grossly intact, moves all extremities normally.  Psych: Patient is not psychotic, no suicidal or hemocidal ideation.  Labs on Admission: I have personally reviewed following labs and imaging studies  CBC:  Recent Labs Lab 03/20/16 1910  WBC 6.3  HGB 15.2  HCT 46.4  MCV 85.6  PLT 172   Basic Metabolic Panel:  Recent Labs Lab 03/20/16 1910  NA 136  K 4.0  CL 103  CO2 24  GLUCOSE 180*  BUN 7  CREATININE 1.14  CALCIUM 9.3  MG 1.9   GFR: CrCl cannot be calculated (Unknown ideal weight.). Liver Function Tests: No results for input(s): AST, ALT, ALKPHOS, BILITOT, PROT, ALBUMIN in the  last 168 hours. No results for input(s): LIPASE, AMYLASE in the last 168 hours. No results for input(s): AMMONIA in the last 168 hours. Coagulation Profile: No results for input(s): INR, PROTIME in the last 168 hours. Cardiac Enzymes: No results for input(s): CKTOTAL, CKMB, CKMBINDEX, TROPONINI in the last 168 hours. BNP (last 3 results) No results for input(s): PROBNP in the last 8760 hours. HbA1C: No results for input(s): HGBA1C in the last 72 hours. CBG: No results for input(s): GLUCAP in the last 168 hours. Lipid Profile: No results for input(s): CHOL, HDL, LDLCALC, TRIG, CHOLHDL, LDLDIRECT in the last 72 hours. Thyroid Function Tests: No results for input(s): TSH, T4TOTAL, FREET4, T3FREE, THYROIDAB in the last 72 hours. Anemia Panel: No results for input(s): VITAMINB12, FOLATE, FERRITIN, TIBC, IRON, RETICCTPCT in the last 72 hours. Urine analysis:    Component Value Date/Time   COLORURINE YELLOW 11/08/2015 2331   APPEARANCEUR CLOUDY (A) 11/08/2015 2331   LABSPEC 1.035 (H) 11/08/2015 2331   PHURINE 5.5 11/08/2015 2331   GLUCOSEU >1000 (A) 11/08/2015 2331   HGBUR NEGATIVE 11/08/2015 2331   BILIRUBINUR NEGATIVE 11/08/2015 2331   KETONESUR 15 (A) 11/08/2015 2331   PROTEINUR 30 (A) 11/08/2015 2331   NITRITE NEGATIVE 11/08/2015 2331   LEUKOCYTESUR NEGATIVE 11/08/2015 2331   Sepsis Labs: @LABRCNTIP (procalcitonin:4,lacticidven:4) )No results found for this or any previous visit (from the past 240 hour(s)).   Radiological Exams on Admission: Dg Chest 2 View  Result Date: 03/20/2016 CLINICAL DATA:  55 year old male with chest pain EXAM: CHEST  2 VIEW COMPARISON:  Chest radiograph dated 03/09/2016 FINDINGS: The heart size and mediastinal contours are within normal limits. Both lungs are clear. The visualized skeletal structures are unremarkable. IMPRESSION: No active cardiopulmonary disease. Electronically Signed   By: Elgie Collard M.D.   On: 03/20/2016 22:20     EKG:  Independently reviewed. Sinus rhythm, QTC 387, LAE, mild ST depression in III/aVF and V5-V6  Assessment/Plan Principal Problem:   Hypertensive urgency Active Problems:   Type 2 diabetes mellitus, uncontrolled (HCC)   CAD - minor CAD at cath Jan 2013   HLD (hyperlipidemia)   GERD (gastroesophageal reflux disease)   Chest pain   Hypertensive urgency: pt sates that he has been compliant to his outpatient oral blood pressure medications. Now he presents with hypertensive urgency with blood pressure 230/160 and also has chest  pain. This patient has multiple chronic comorbidities including hypertension, hyperlipidemia, diabetes mellitus, PE, CAD, myocardial infarction. Now presenting with hypertensive emergency and chest pain. Patient requires inpatient status due to high intensity of service, high risk for further deterioration and high frequency of surveillance required. I certify that at the point of admission it is my clinical judgment that the patient will require inpatient hospital care spanning beyond 2 midnights from the point of admission.  -Will admit to SDU as inpt -switch labetalol drip to nitroglycerin drip since patient has bradycardia -Nitroglycerin drip for now, the of goal of bp reduction is by about 25 to 30% in first several hours, at SBP 160 to 180 mmHg. -continue home oral meds -prn tylenol for HA  DM-II: Last A1c 11.2 on 11/08/15, poorly controled. Patient is taking Lantus, Humulin and metformin at home -will decrease Lantus dose from 35-24 units daily  -SSI  HLD: Last LDL was 122 on 11/08/15  -Continue home medications: Pravastatin  GERD: -Protonix  Chest pain and hx of CAD - minor CAD at cath Jan 2013: his CP is most likely due to demanding ischemia secondary to hypertensive urgency. Initial troponin negative. Has some mild ST depression. - cycle CE q6 x3 and repeat her EKG in the am  - Nitroglycerin gtt as above - Prn Morphine, and aspirin, pravastatin - Risk  factor stratification: will check FLP, UDS and A1C  - 2d echo   DVT ppx: SQ Lovenox Code Status: Full code Family Communication: None at bed side.  Disposition Plan:  Anticipate discharge back to previous jail environment Consults called:  none Admission status: SDU/inpation       Date of Service 03/20/2016    Lorretta Harp [facility] Pager [phone]  If 7PM-7AM, please contact night-coverage [email or web address] Password [email or web address] 03/20/2016, 11:44 PM

## 2016-03-21 ENCOUNTER — Encounter (HOSPITAL_COMMUNITY): Payer: Self-pay | Admitting: Nurse Practitioner

## 2016-03-21 LAB — MRSA PCR SCREENING: MRSA by PCR: NEGATIVE

## 2016-03-21 LAB — LIPID PANEL
CHOL/HDL RATIO: 5.1 ratio
CHOLESTEROL: 187 mg/dL (ref 0–200)
HDL: 37 mg/dL — AB (ref >40)
LDL Cholesterol: 127 mg/dL — ABNORMAL HIGH (ref 0–99)
Triglycerides: 116 mg/dL (ref <150)
VLDL: 23 mg/dL (ref 0–40)

## 2016-03-21 LAB — GLUCOSE, CAPILLARY
GLUCOSE-CAPILLARY: 99 mg/dL (ref 65–99)
GLUCOSE-CAPILLARY: 187 mg/dL — AB (ref 65–99)
GLUCOSE-CAPILLARY: 149 mg/dL — AB (ref 65–99)
Glucose-Capillary: 150 mg/dL — ABNORMAL HIGH (ref 65–99)
Glucose-Capillary: 187 mg/dL — ABNORMAL HIGH (ref 65–99)

## 2016-03-21 LAB — RAPID URINE DRUG SCREEN, HOSP PERFORMED
Amphetamines: NOT DETECTED
BARBITURATES: NOT DETECTED
BENZODIAZEPINES: NOT DETECTED
COCAINE: NOT DETECTED
Opiates: POSITIVE — AB
Tetrahydrocannabinol: NOT DETECTED

## 2016-03-21 LAB — TROPONIN I: Troponin I: <0.03 ng/mL (ref <0.03)

## 2016-03-21 MED ORDER — CARVEDILOL 25 MG PO TABS
50.0000 mg | ORAL_TABLET | Freq: Two times a day (BID) | ORAL | Status: DC
  Administered 2016-03-21 – 2016-03-23 (×5): 50 mg via ORAL
  Filled 2016-03-21 (×5): qty 2

## 2016-03-21 MED ORDER — CLONIDINE HCL 0.3 MG PO TABS
0.3000 mg | ORAL_TABLET | Freq: Four times a day (QID) | ORAL | Status: DC
  Administered 2016-03-21 – 2016-03-28 (×28): 0.3 mg via ORAL
  Filled 2016-03-21 (×2): qty 1
  Filled 2016-03-21 (×5): qty 3
  Filled 2016-03-21: qty 1
  Filled 2016-03-21 (×13): qty 3
  Filled 2016-03-21: qty 1
  Filled 2016-03-21 (×2): qty 3
  Filled 2016-03-21: qty 1
  Filled 2016-03-21: qty 3
  Filled 2016-03-21: qty 1
  Filled 2016-03-21 (×2): qty 3

## 2016-03-21 MED ORDER — INFLUENZA VAC SPLIT QUAD 0.5 ML IM SUSY: 0.5000 mL | PREFILLED_SYRINGE | INTRAMUSCULAR | Status: DC

## 2016-03-21 MED ORDER — ISOSORBIDE MONONITRATE ER 60 MG PO TB24
60.0000 mg | ORAL_TABLET | Freq: Every day | ORAL | Status: DC
  Administered 2016-03-22 – 2016-03-28 (×7): 60 mg via ORAL
  Filled 2016-03-21: qty 2
  Filled 2016-03-21: qty 1
  Filled 2016-03-21 (×5): qty 2

## 2016-03-21 MED ORDER — MORPHINE SULFATE (PF) 2 MG/ML IV SOLN
2.0000 mg | INTRAVENOUS | Status: DC | PRN
  Administered 2016-03-21 – 2016-03-27 (×27): 2 mg via INTRAVENOUS
  Filled 2016-03-21 (×28): qty 1

## 2016-03-21 MED ORDER — INFLUENZA VAC SPLIT QUAD 0.5 ML IM SUSY
0.5000 mL | PREFILLED_SYRINGE | INTRAMUSCULAR | Status: AC
  Administered 2016-03-21: 0.5 mL via INTRAMUSCULAR
  Filled 2016-03-21: qty 0.5

## 2016-03-21 NOTE — Progress Notes (Signed)
PROGRESS NOTE    Raymond LickJames Brewbaker  ZOX:[id number] DOB: June 22, 1960 DOA: 03/20/2016 PCP: Berna BueHAYNES,GREGORY, MD    Brief Narrative:  Raymond Crane is a 55 y.o. male with medical history significant of hypertension, hyperlipidemia, diabetes mellitus, GERD, PE 2013, CABG, myocardial infarction, who presents with chest pain and elevated blood pressure. Pt states that his chest pain started yesterday morning. His chest is located in the left septal chest, constant, 8 out of 10 in severity, pressure-like, radiating to the left arm. It is not aggravated or alleviated by any known factors. He was found to have elevated blood pressure of SBP 230 per EMS. He states that he is taking 7 different types of blood pressure medications, and has been compliant to medications. He states that he took all his medications today. Patient also complains of headache over occipital area, which is constant, 8 out of 10 in severity, nonradiating. No unilateral weakness, numbness or tingling in extremities. No vision change or hearing loss. Patient does not have nausea, vomiting, diarrhea, abdominal pain, symptoms of UTI. No fever, chills, cough. Pt was given one dose of clonidine, nitroglycerin patch in ED. Labetalol drip was ordered, but not started yet in ED. Patient was found to have negative troponin, BNP 21.7, WBC 6.3, creatinine 1.14, temperature normal, bradycardia. Negative chest x-ray. Patient is admitted to stepdown as inpatient.  Cardiology was consulted for assistance in hypertensive urgency management.   Assessment & Plan:   Principal Problem:   Hypertensive urgency Active Problems:   Type 2 diabetes mellitus, uncontrolled (HCC)   CAD - minor CAD at cath Jan 2013   HLD (hyperlipidemia)   GERD (gastroesophageal reflux disease)   Chest pain  Hypertensive urgency - pt sates that he has been compliant to his outpatient oral blood pressure medications - presents with hypertensive urgency with blood pressure 230/160 and chest  pain - Cardiology consulted - switched from labetolol drip to nitroglycerin drip 2/2 bradycardia - continue home medications - Acetaminophen PRN for headache  DM-II:  - Last A1c 11.2 on 11/08/15 - poorly controled -Lantus dose 24 units daily  -SSI - follow CBG measurements  HLD:  - Last LDL was 122 on 11/08/15  -Continue pravastatin  GERD: -Protonix  Chest pain and hx of CAD - minor CAD at cath Jan 2013:  - CP is most likely due to demanding ischemia secondary to hypertensive urgency - Initial troponin negative - ST depression noted on EKG - Cardiology consulted - Troponin negative x 2 - Nitroglycerin gtt as above - Prn Morphine, and aspirin, pravastatin - 2d echo ordered   DVT ppx: SQ Lovenox Code Status: Full code Family Communication: None at bed side.  Disposition Plan:  Anticipate discharge back to previous jail environment Consults called:  none Admission status: SDU/inpation    Consultants:   Cardiology  Procedures:   none  Antimicrobials:   none    Subjective: Patient seen and evaluated at bedside.  States his headache is still severe.  Voices that he need medication for his headache.  Mentions to me that he takes all his blood pressure medications as prescribed at home.  Does have a cardiac history but denies an outpatient cardiologist.  No changes in vision, no weakness, no increased work of breathing or shortness of breath.  Objective: Vitals:   03/21/16 0316 03/21/16 0400 03/21/16 0700 03/21/16 0800  BP: (!) 192/121 (!) 195/126 (!) 198/135 (!) 179/160  Pulse:   62 67  Resp: 15  14 15   Temp: 98.2 F (36.8  C)   98 F (36.7 C)  TempSrc: Oral   Oral  SpO2: 97%  96% 95%    Intake/Output Summary (Last 24 hours) at 03/21/16 1101 Last data filed at 03/21/16 0803  Gross per 24 hour  Intake            20.76 ml  Output             1050 ml  Net         -1029.24 ml   There were no vitals filed for this visit.  Examination:  General  exam: Appears calm and comfortable  Respiratory system: Clear to auscultation. Respiratory effort normal. Cardiovascular system: S1 & S2 heard, RRR. No JVD, murmurs, rubs, gallops or clicks. No pedal edema. Gastrointestinal system: Abdomen is nondistended, soft and nontender. No organomegaly or masses felt. Normal bowel sounds heard. Central nervous system: Alert and oriented. No focal neurological deficits. Extremities: Symmetric 5 x 5 power. Skin: No rashes, lesions or ulcers Psychiatry: Judgement and insight appear normal. Mood & affect appropriate.     Data Reviewed: I have personally reviewed following labs and imaging studies  CBC:  Recent Labs Lab 03/20/16 1910  WBC 6.3  HGB 15.2  HCT 46.4  MCV 85.6  PLT 172   Basic Metabolic Panel:  Recent Labs Lab 03/20/16 1910  NA 136  K 4.0  CL 103  CO2 24  GLUCOSE 180*  BUN 7  CREATININE 1.14  CALCIUM 9.3  MG 1.9   GFR: CrCl cannot be calculated (Unknown ideal weight.). Liver Function Tests: No results for input(s): AST, ALT, ALKPHOS, BILITOT, PROT, ALBUMIN in the last 168 hours. No results for input(s): LIPASE, AMYLASE in the last 168 hours. No results for input(s): AMMONIA in the last 168 hours. Coagulation Profile: No results for input(s): INR, PROTIME in the last 168 hours. Cardiac Enzymes:  Recent Labs Lab 03/21/16 0048 03/21/16 0450  TROPONINI <0.03 <0.03   BNP (last 3 results) No results for input(s): PROBNP in the last 8760 hours. HbA1C: No results for input(s): HGBA1C in the last 72 hours. CBG:  Recent Labs Lab 03/21/16 0033 03/21/16 0843  GLUCAP 150* 99   Lipid Profile:  Recent Labs  03/21/16 0450  CHOL 187  HDL 37*  LDLCALC 127*  TRIG 116  CHOLHDL 5.1   Thyroid Function Tests: No results for input(s): TSH, T4TOTAL, FREET4, T3FREE, THYROIDAB in the last 72 hours. Anemia Panel: No results for input(s): VITAMINB12, FOLATE, FERRITIN, TIBC, IRON, RETICCTPCT in the last 72  hours. Sepsis Labs: No results for input(s): PROCALCITON, LATICACIDVEN in the last 168 hours.  Recent Results (from the past 240 hour(s))  MRSA PCR Screening     Status: None   Collection Time: 03/21/16 12:09 AM  Result Value Ref Range Status   MRSA by PCR NEGATIVE NEGATIVE Final    Comment:        The GeneXpert MRSA Assay (FDA approved for NASAL specimens only), is one component of a comprehensive MRSA colonization surveillance program. It is not intended to diagnose MRSA infection nor to guide or monitor treatment for MRSA infections.          Radiology Studies: Dg Chest 2 View  Result Date: 03/20/2016 CLINICAL DATA:  55 year old male with chest pain EXAM: CHEST  2 VIEW COMPARISON:  Chest radiograph dated 03/09/2016 FINDINGS: The heart size and mediastinal contours are within normal limits. Both lungs are clear. The visualized skeletal structures are unremarkable. IMPRESSION: No active cardiopulmonary disease. Electronically Signed  By: Elgie Collard M.D.   On: 03/20/2016 22:20        Scheduled Meds: . amLODipine  10 mg Oral q morning - 10a  . aspirin  81 mg Oral q morning - 10a  . carvedilol  25 mg Oral BID WC  . cloNIDine  0.3 mg Oral Q6H  . enoxaparin (LOVENOX) injection  40 mg Subcutaneous QHS  . furosemide  40 mg Oral Daily  . gabapentin  300 mg Oral TID  . hydrALAZINE  100 mg Oral TID  . insulin aspart  0-9 Units Subcutaneous TID WC  . insulin glargine  24 Units Subcutaneous QHS  . isosorbide mononitrate  30 mg Oral Daily  . lisinopril  40 mg Oral q morning - 10a  . pantoprazole  20 mg Oral q morning - 10a  . pravastatin  40 mg Oral QHS  . ranolazine  1,000 mg Oral Q12H  . spironolactone  50 mg Oral Daily   Continuous Infusions: . nitroGLYCERIN 30 mcg/min (03/21/16 0913)     LOS: 1 day    Time spent: 40 minutes    Bennett Scrape, MD [facility] Pager [phone]  If 7PM-7AM, please contact night-coverage [email or web address] Password  [email or web address] 03/21/2016, 11:01 AM

## 2016-03-21 NOTE — Consult Note (Signed)
Cardiology Consult    Patient ID: Raymond LickJames Bahl MRN: [id number], DOB/AGE: 20-Nov-1960   Admit date: 03/20/2016 Date of Consult: 03/21/2016  Primary Physician: Berna BueHAYNES,GREGORY, MD Primary Cardiologist: Mendel RyderH. Smith, MD  Requesting Provider: Donny PiqueA. Ukleja  Patient Profile    89 y /o ? with a h/o nonobs CAD, HTN urgency, HL, and DM II, who was admitted 10/3 secondary to c/p and hypertensive urgency.  Past Medical History   Past Medical History:  Diagnosis Date  . Essential hypertension   . GERD (gastroesophageal reflux disease)   . Hyperlipidemia   . Non-obstructive CAD    a. 10/2011 NSTEMI/Cath: nonobs dzs, EF nl-->Med Rx; b. 10/2015 MV: apical/anterior/apical reversible defect->low risk->Med rx; c. 11/2015 Cath: LM nl, LAD nl, D1 70ost, LCX 40, OM1 40, Om2 40, RCA 6251m, EF 55-65%.  . Pulmonary embolism (HCC) 2014  . Uncontrolled type II diabetes mellitus (HCC)    a. 11/08/2015 A1c 11.2.    Past Surgical History:  Procedure Laterality Date  . CARDIAC CATHETERIZATION  2013   at [facility]   . CARDIAC CATHETERIZATION N/A 11/23/2015   Procedure: Left Heart Cath and Coronary Angiography;  Surgeon: Lyn RecordsHenry W Smith, MD;  [facility];  Service: Cardiovascular;  Laterality: N/A;  . INGUINAL HERNIA REPAIR Right      Allergies  No Known Allergies  History of Present Illness    55 year old male with a history of chest pain, hypertension, hyperlipidemia, diabetes, and pulmonary embolism who has had multiple admissions over the past 2-3 months related to chest pain. He was admitted to [facility] in late May 2017, ruled out, and had a low risk stress test. He was subsequently discharged with plan for outpatient follow-up and consideration for outpatient catheterization if he were to have recurrent chest pain. Unfortunately, he did have recurrent chest discomfort and presented to [facility] on June 6. There, ECG was nonacute and troponin normal.  He was tx to [facility] and  underwent cath revealing nonobs dzs, and he was medically managed.  He was seen in f/u in our office in July, @ which time he was doing relatively well on multiple antihypertensives.  It was felt that his c/p was most likely related to demand ischemia when BPs elevate.    Since his last visit, he says that he has done well w/o any chest pain.  His blood pressure is checked 2-3 x/day in the prison in which he [facility], and he says that he is usually ~ 140 systolic.  He has been compliant with his outpt meds.  He was in his [facility] until Monday 10/2, when he began to note sscp w/ headache.  He says his BP has been trending in the 200's since then and c/p was constant, which prompted transfer to [facility] on the evening of 10/3.  Here, he was hypertensive @ 221/146.  He was treated w/ IV labetalol and developed bradycardia and this was switched to IV ntg. He was admitted and home meds were resumed.  He remains on IV ntg @ 40 mcg/min - bp currently 144/94.  C/p has resolved.  Inpatient Medications    . amLODipine  10 mg Oral q morning - 10a  . aspirin  81 mg Oral q morning - 10a  . carvedilol  25 mg Oral BID WC  . cloNIDine  0.3 mg Oral Q6H  . enoxaparin (LOVENOX) injection  40 mg Subcutaneous QHS  . furosemide  40 mg Oral Daily  . gabapentin  300  mg Oral TID  . hydrALAZINE  100 mg Oral TID  . insulin aspart  0-9 Units Subcutaneous TID WC  . insulin glargine  24 Units Subcutaneous QHS  . isosorbide mononitrate  30 mg Oral Daily  . lisinopril  40 mg Oral q morning - 10a  . pantoprazole  20 mg Oral q morning - 10a  . pravastatin  40 mg Oral QHS  . ranolazine  1,000 mg Oral Q12H  . spironolactone  50 mg Oral Daily    Family History    Family History  Problem Relation Age of Onset  . Heart disease Mother   . Stroke Mother   . Diabetes Sister   . Diabetes Brother   . Hypertension Sister   . Hypertension Brother     Social History    Social History   Social History  . Marital status: Single     Spouse name: N/A  . Number of children: N/A  . Years of education: N/A   Occupational History  . Not on file.   Social History Main Topics  . Smoking status: Former Smoker    Packs/day: 1.00    Years: 25.00    Types: Cigarettes    Quit date: 12/17/2014  . Smokeless tobacco: Never Used  . Alcohol use Yes     Comment: 11/09/2015 "nothing since July 2016"  . Drug use: No  . Sexual activity: Not Currently   Other Topics Concern  . Not on file   Social History Narrative   Imprisoned.  Sedentary.    Review of Systems    General:  No chills, fever, night sweats or weight changes.  Cardiovascular:  +++ chest pain, no dyspnea on exertion, edema, orthopnea, palpitations, paroxysmal nocturnal dyspnea. Dermatological: No rash, lesions/masses Respiratory: No cough, dyspnea Urologic: No hematuria, dysuria Abdominal:   No nausea, vomiting, diarrhea, bright red blood per rectum, melena, or hematemesis Neurologic:  +++ headache.  No visual changes, wkns, changes in mental status. All other systems reviewed and are otherwise negative except as noted above.  Physical Exam    Blood pressure (!) 144/94, pulse 70, temperature 98.6 F (37 C), temperature source Oral, resp. rate 14, SpO2 92 %.  General: Pleasant, NAD Psych: Normal affect. Neuro: Alert and oriented X 3. Moves all extremities spontaneously. HEENT: Normal  Neck: Supple without bruits or JVD. Lungs:  Resp regular and unlabored, CTA. Heart: RRR no s3, s4, 2/6 syst murmur heard throughout but loudest @ the apex.  Abdomen: Soft, non-tender, non-distended, BS + x 4.  Extremities: No clubbing, cyanosis or edema. DP/PT/Radials 2+ and equal bilaterally.  Labs    Troponin [facility] of Care Test)  Recent Labs  03/20/16 1904  TROPIPOC 0.00    Recent Labs  03/21/16 0048 03/21/16 0450 03/21/16 1031  TROPONINI <0.03 <0.03 <0.03   Lab Results  Component Value Date   WBC 6.3 03/20/2016   HGB 15.2 03/20/2016   HCT 46.4 03/20/2016    MCV 85.6 03/20/2016   PLT 172 03/20/2016    Recent Labs Lab 03/20/16 1910  NA 136  K 4.0  CL 103  CO2 24  BUN 7  CREATININE 1.14  CALCIUM 9.3  GLUCOSE 180*   Lab Results  Component Value Date   CHOL 187 03/21/2016   HDL 37 (L) 03/21/2016   LDLCALC 127 (H) 03/21/2016   TRIG 116 03/21/2016    Radiology Studies    Dg Chest 2 View  Result Date: 03/20/2016 CLINICAL DATA:  55 year old male with  chest pain EXAM: CHEST  2 VIEW COMPARISON:  Chest radiograph dated 03/09/2016 FINDINGS: The heart size and mediastinal contours are within normal limits. Both lungs are clear. The visualized skeletal structures are unremarkable. IMPRESSION: No active cardiopulmonary disease. Electronically Signed   By: Elgie Collard M.D.   On: 03/20/2016 22:20    ECG & Cardiac Imaging    RSR, 60, inf ST dep, LAE.  Assessment & Plan    1.  Hypertensive Urgency: Pt presented on the evening of 10/3 with a two day h/o chest pain and elevated BP's with systolics in the 220's on arrival.  He says that he has been compliant with meds in prison and was doing fine with bp's typically in the 140's until Monday 10/2.  Currently, BP is in 140's on 40 mcg of IV ntg and prior home meds.  He is on high doses of multiple medications including hydralazine 100 TID, clonidine 0.3 q6h, coreg 25 bid, amlodipine 10 daily, lasix 40 daily, lisinopril 40 daily, and spironolactone 50 daily.  We will increase his coreg to 50 bid but beyond that, options are fairly limited and include titration of home dose of imdur or potentially initiating minoxidil or methyldopa.  Prior renal duplex was negative for RAS.  2.  Chest pain/non-obstructive CAD:  S/p prior admissions for c/p earlier this year with intermediate risk myoview in May and non-obstructive CAD on cath in June.  Trop is nl despite over 24 hrs of constant c/p.  Historically, c/p has been present in setting of elevated BPs, thus goal continues to be BP control.  Would not pursue  any further ischemic evaluation.  Cont asa/statin/ blocker/nitrate.  3.  HL:  LDL 127 on pravachol therapy.  This is higher than May.  ? Compliance.  4. DMII:  A1c prev 11.2 in May.  May be valuable to f/u to determine overall compliance.  Signed, Nicolasa Ducking, NP 03/21/2016, 1:13 PM  Patient seen, examined. Available data reviewed. Agree with findings, assessment, and plan as outlined by Ward Givens, NP. The patient is independently interviewed and examined. He is alert and oriented, in no distress. JVP is normal, carotid upstrokes normal without bruits, lungs are clear, heart is regular rate and rhythm with an S4 gallop, abdomen soft and nontender, extremities show no edema. EKG, cardiac markers, and recent cardiac catheterization report are all reviewed. The patient's clinical presentation is consistent with hypertensive emergency. He has mild to moderate nonobstructive coronary artery disease and does not require further cardiac evaluation at this time. The biggest issue is that his hypertension is exceedingly difficult to control. He is on multiple drugs at maximum dose. Will increase carvedilol to 50 mg twice daily. He is also on high doses of calcium blocker, ACE inhibitor, Aldactone, hydralazine, clonidine, and furosemide. Will also increase his isosorbide. Beyond that, his options are fairly limited. Minoxidil might be a reasonable consideration.  Tonny Bollman, M.D. 03/21/2016 3:24 PM

## 2016-03-22 ENCOUNTER — Inpatient Hospital Stay (HOSPITAL_COMMUNITY): Payer: Medicaid Other

## 2016-03-22 DIAGNOSIS — I2510 Atherosclerotic heart disease of native coronary artery without angina pectoris: Secondary | ICD-10-CM

## 2016-03-22 DIAGNOSIS — R079 Chest pain, unspecified: Secondary | ICD-10-CM

## 2016-03-22 DIAGNOSIS — I2583 Coronary atherosclerosis due to lipid rich plaque: Secondary | ICD-10-CM

## 2016-03-22 LAB — GLUCOSE, CAPILLARY
GLUCOSE-CAPILLARY: 99 mg/dL (ref 65–99)
GLUCOSE-CAPILLARY: 178 mg/dL — AB (ref 65–99)
GLUCOSE-CAPILLARY: 101 mg/dL — AB (ref 65–99)
Glucose-Capillary: 160 mg/dL — ABNORMAL HIGH (ref 65–99)

## 2016-03-22 LAB — HEMOGLOBIN A1C
Hgb A1c MFr Bld: 8.3 % — ABNORMAL HIGH (ref 4.8–5.6)
MEAN PLASMA GLUCOSE: 192 mg/dL

## 2016-03-22 LAB — ECHOCARDIOGRAM COMPLETE

## 2016-03-22 MED ORDER — HYDROCODONE-ACETAMINOPHEN 5-325 MG PO TABS
1.0000 | ORAL_TABLET | Freq: Four times a day (QID) | ORAL | Status: DC | PRN
  Administered 2016-03-22 – 2016-03-28 (×19): 1 via ORAL
  Filled 2016-03-22 (×19): qty 1

## 2016-03-22 MED ORDER — MINOXIDIL 10 MG PO TABS
10.0000 mg | ORAL_TABLET | Freq: Every day | ORAL | Status: DC
  Administered 2016-03-22 – 2016-03-25 (×4): 10 mg via ORAL
  Filled 2016-03-22 (×4): qty 1

## 2016-03-22 NOTE — Progress Notes (Signed)
  Echocardiogram 2D Echocardiogram has been performed.  Janalyn HarderWest, Dniyah Grant R 03/22/2016, 4:07 PM

## 2016-03-22 NOTE — Progress Notes (Signed)
Patient Name: Raymond Crane Date of Encounter: 03/22/2016  Primary Cardiologist: Dr. Alicia AmelSmith   Hospital Problem List     Principal Problem:   Hypertensive urgency Active Problems:   Type 2 diabetes mellitus, uncontrolled (HCC)   CAD - minor CAD at cath Jan 2013   HLD (hyperlipidemia)   GERD (gastroesophageal reflux disease)   Chest pain     Subjective   Feels well,denies chest pain.  Inpatient Medications    Scheduled Meds: . amLODipine  10 mg Oral q morning - 10a  . aspirin  81 mg Oral q morning - 10a  . carvedilol  50 mg Oral BID WC  . cloNIDine  0.3 mg Oral Q6H  . enoxaparin (LOVENOX) injection  40 mg Subcutaneous QHS  . furosemide  40 mg Oral Daily  . gabapentin  300 mg Oral TID  . hydrALAZINE  100 mg Oral TID  . insulin aspart  0-9 Units Subcutaneous TID WC  . insulin glargine  24 Units Subcutaneous QHS  . isosorbide mononitrate  60 mg Oral Daily  . lisinopril  40 mg Oral q morning - 10a  . pantoprazole  20 mg Oral q morning - 10a  . pravastatin  40 mg Oral QHS  . ranolazine  1,000 mg Oral Q12H  . spironolactone  50 mg Oral Daily   Continuous Infusions: . nitroGLYCERIN 15 mcg/min (03/22/16 0234)   PRN Meds:.acetaminophen, ALPRAZolam, HYDROcodone-acetaminophen, morphine injection, ondansetron (ZOFRAN) IV, zolpidem   Vital Signs    Vitals:   03/22/16 0500 03/22/16 0700 03/22/16 0800 03/22/16 0900  BP:  (!) 171/109 (!) 183/108 (!) 186/111  Pulse:  67 70   Resp:  14 13 (!) 21  Temp: 99.3 F (37.4 C)  98.4 F (36.9 C)   TempSrc: Oral  Oral   SpO2:  96% 93%     Intake/Output Summary (Last 24 hours) at 03/22/16 0932 Last data filed at 03/22/16 0800  Gross per 24 hour  Intake            941.6 ml  Output             1500 ml  Net           -558.4 ml   There were no vitals filed for this visit.  Physical Exam   GEN: Well nourished, well developed, in no acute distress.  HEENT: Grossly normal.  Neck: Supple, no JVD, carotid bruits, or masses. Cardiac:  RRR, no murmurs, rubs, S4 gallop heard.. No clubbing, cyanosis, edema.  Radials/DP/PT 2+ and equal bilaterally.  Respiratory:  Respirations regular and unlabored, clear to auscultation bilaterally. GI: Soft, nontender, nondistended, BS + x 4. MS: no deformity or atrophy. Skin: warm and dry, no rash. Neuro:  Strength and sensation are intact. Psych: AAOx3.  Normal affect.  Labs    CBC  Recent Labs  03/20/16 1910  WBC 6.3  HGB 15.2  HCT 46.4  MCV 85.6  PLT 172   Basic Metabolic Panel  Recent Labs  03/20/16 1910  NA 136  K 4.0  CL 103  CO2 24  GLUCOSE 180*  BUN 7  CREATININE 1.14  CALCIUM 9.3  MG 1.9   Cardiac Enzymes  Recent Labs  03/21/16 0048 03/21/16 0450 03/21/16 1031  TROPONINI <0.03 <0.03 <0.03   Hemoglobin A1C  Recent Labs  03/21/16 0450  HGBA1C 8.3*   Fasting Lipid Panel  Recent Labs  03/21/16 0450  CHOL 187  HDL 37*  LDLCALC 127*  TRIG 116  CHOLHDL 5.1    Telemetry    NSR - Personally Reviewed  ECG    NSR, inferior ST depression- Personally Reviewed  Radiology    Dg Chest 2 View  Result Date: 03/20/2016 CLINICAL DATA:  55 year old male with chest pain EXAM: CHEST  2 VIEW COMPARISON:  Chest radiograph dated 03/09/2016 FINDINGS: The heart size and mediastinal contours are within normal limits. Both lungs are clear. The visualized skeletal structures are unremarkable. IMPRESSION: No active cardiopulmonary disease. Electronically Signed   By: Elgie Collard M.D.   On: 03/20/2016 22:20     Cardiac Studies   Echo pending   Patient Profile     23 y /o male with a h/o nonobs CAD, HTN urgency, HL, and DM II, who was admitted 10/3 secondary to c/p and  Assessment & Plan    1.  Hypertensive Urgency: Pt presented on the evening of 10/3 with a two day h/o chest pain and elevated BP's with systolics in the 220's on arrival.  He says that he has been compliant with meds in prison and was doing fine with bp's typically in the 140's  until Monday 10/2.   He is on high doses of multiple medications including hydralazine 100 TID, clonidine 0.3 q6h, coreg 50 bid, amlodipine 10 daily, lasix 40 daily, lisinopril 40 daily, and spironolactone 50 daily.  Prior renal duplex was negative for RAS.  Will add 10mg  minoxidil daily as he remains hypertensive.   2.  Chest pain/non-obstructive CAD:  S/p prior admissions for c/p earlier this year with intermediate risk myoview in May and non-obstructive CAD on cath in June.  Trop is nl despite over 24 hrs of constant c/p.  Historically, c/p has been present in setting of elevated BPs, thus goal continues to be BP control.  Would not pursue any further ischemic evaluation.  Cont asa/statin/? blocker/nitrate.  3.  HL:  LDL 127 on pravachol therapy.  This is higher than May.  ? Compliance.  4. DMII:  A1c prev 11.2 in May.  May be valuable to f/u to determine overall compliance.  Signed, Little Ishikawa, NP  03/22/2016, 9:32 AM   Patient seen, examined. Available data reviewed. Agree with findings, assessment, and plan as outlined by Suzzette Righter, NP. Pt is comfortable, no pain, on exam heart is RRR, lungs CTA, extremities without edema. Hypertension is extremely difficult to control. Minoxidil added. Would wean off IV NTG and treat with oral agents. He is on max doses of multiple agents. Not much else to add to his care at this point.   Tonny Bollman, M.D. 03/22/2016 11:13 AM

## 2016-03-22 NOTE — Progress Notes (Signed)
PROGRESS NOTE    Raymond Crane  ZOX:[id number] DOB: Mar 07, 1961 DOA: 03/20/2016 PCP: Berna Bue, MD    Brief Narrative:  Raymond Crane is a 55 y.o. male with medical history significant of hypertension, hyperlipidemia, diabetes mellitus, GERD, PE 2013, CABG, myocardial infarction, who presents with chest pain and elevated blood pressure. Pt states that his chest pain started yesterday morning. His chest is located in the left septal chest, constant, 8 out of 10 in severity, pressure-like, radiating to the left arm. It is not aggravated or alleviated by any known factors. He was found to have elevated blood pressure of SBP 230 per EMS. He states that he is taking 7 different types of blood pressure medications, and has been compliant to medications. He states that he took all his medications today. Patient also complains of headache over occipital area, which is constant, 8 out of 10 in severity, nonradiating. No unilateral weakness, numbness or tingling in extremities. No vision change or hearing loss. Patient does not have nausea, vomiting, diarrhea, abdominal pain, symptoms of UTI. No fever, chills, cough. Pt was given one dose of clonidine, nitroglycerin patch in ED. Labetalol drip was ordered, but not started yet in ED. Patient was found to have negative troponin, BNP 21.7, WBC 6.3, creatinine 1.14, temperature normal, bradycardia. Negative chest x-ray. Patient is admitted to stepdown as inpatient.  Cardiology was consulted for assistance in hypertensive urgency management.   Assessment & Plan:   Principal Problem:   Hypertensive urgency Active Problems:   Type 2 diabetes mellitus, uncontrolled (HCC)   CAD - minor CAD at cath Jan 2013   HLD (hyperlipidemia)   GERD (gastroesophageal reflux disease)   Chest pain  Hypertensive urgency - pt sates that he has been compliant to his outpatient oral blood pressure medications - presents with hypertensive urgency with blood pressure 230/160 and chest  pain - Cardiology consulted - switched from labetolol drip to nitroglycerin drip 2/2 bradycardia (still on Nitro drip) - continue home medications of amlodipine, carvedilol, clonidine, furosemide, hydralazine, Isosorbide Mononitrate, Spironolactone - per cardiology will increase carvedilol to 50mg  daily - Acetaminophen PRN for headache and ordered Norco 5/325 q8 PRN severe pain  DM-II:  - Last A1c 11.2 on 11/08/15  - HgA1c 8.3 on this admission which is much improved -Lantus dose 24 units daily  -SSI - follow CBG measurements (highest appears to be 189)  HLD:  - Last LDL was 127 on 03/21/16  -Continue pravastatin  GERD: -Protonix  Chest pain and hx of CAD - minor CAD at cath Jan 2013:  - CP is most likely due to demanding ischemia secondary to hypertensive urgency - Initial troponin negative - ST depression noted on EKG - Cardiology consulted - Troponin negative x 3 - Nitroglycerin gtt as above - Prn Morphine, and aspirin, pravastatin  DVT ppx: SQ Lovenox Code Status: Full code Family Communication: None at bed side.  Disposition Plan:  Anticipate discharge back to previous jail environment Consults called: cardiology Admission status: SDU/inpatient- can transfer to medical floor when off nitro drip   Consultants:   Cardiology  Procedures:   none  Antimicrobials:   none    Subjective: Patient awake and watching television.  Blood pressures have been lower than yesterday.  Reports he still has a headache and is asking for a pain pill.  Voiced that he ate reasonably well but not great because he does not care for the food here.  Security that is bedside is asking what plan is for patient in terms  of discharge and transfer.  Patient denies chest pain, chest pressure, shortness of breath, increased work of breathing.  No nausea, vomiting, diarrhea or constipation.    Objective: Vitals:   03/21/16 2200 03/21/16 2252 03/21/16 2300 03/22/16 0500  BP: (!) 177/112   135/69   Pulse: 73  77   Resp: 14  (!) 22   Temp:  98.6 F (37 C)  99.3 F (37.4 C)  TempSrc:  Oral  Oral  SpO2: 95%  92%     Intake/Output Summary (Last 24 hours) at 03/22/16 0804 Last data filed at 03/22/16 0516  Gross per 24 hour  Intake           863.65 ml  Output             1250 ml  Net          -386.35 ml   There were no vitals filed for this visit.  Examination:  General exam: Appears calm and comfortable  Respiratory system: Clear to auscultation. Respiratory effort normal. Cardiovascular system: S1 & S2 heard, RRR. II/VI systolic murmur heard best at apex, No JVD, murmurs, rubs, or clicks. No pedal edema. Gastrointestinal system: Abdomen is nondistended, soft and nontender. No organomegaly or masses felt. Normal bowel sounds heard. Central nervous system: Alert and oriented. No focal neurological deficits. Extremities: Symmetric 5 x 5 power. Skin: No rashes, lesions or ulcers Psychiatry: Judgement and insight appear normal. Mood & affect appropriate.     Data Reviewed: I have personally reviewed following labs and imaging studies  CBC:  Recent Labs Lab 03/20/16 1910  WBC 6.3  HGB 15.2  HCT 46.4  MCV 85.6  PLT 172   Basic Metabolic Panel:  Recent Labs Lab 03/20/16 1910  NA 136  K 4.0  CL 103  CO2 24  GLUCOSE 180*  BUN 7  CREATININE 1.14  CALCIUM 9.3  MG 1.9   GFR: CrCl cannot be calculated (Unknown ideal weight.). Liver Function Tests: No results for input(s): AST, ALT, ALKPHOS, BILITOT, PROT, ALBUMIN in the last 168 hours. No results for input(s): LIPASE, AMYLASE in the last 168 hours. No results for input(s): AMMONIA in the last 168 hours. Coagulation Profile: No results for input(s): INR, PROTIME in the last 168 hours. Cardiac Enzymes:  Recent Labs Lab 03/21/16 0048 03/21/16 0450 03/21/16 1031  TROPONINI <0.03 <0.03 <0.03   BNP (last 3 results) No results for input(s): PROBNP in the last 8760 hours. HbA1C:  Recent Labs   03/21/16 0450  HGBA1C 8.3*   CBG:  Recent Labs Lab 03/21/16 0033 03/21/16 0843 03/21/16 1240 03/21/16 1702 03/21/16 2058  GLUCAP 150* 99 187* 187* 149*   Lipid Profile:  Recent Labs  03/21/16 0450  CHOL 187  HDL 37*  LDLCALC 127*  TRIG 116  CHOLHDL 5.1   Thyroid Function Tests: No results for input(s): TSH, T4TOTAL, FREET4, T3FREE, THYROIDAB in the last 72 hours. Anemia Panel: No results for input(s): VITAMINB12, FOLATE, FERRITIN, TIBC, IRON, RETICCTPCT in the last 72 hours. Sepsis Labs: No results for input(s): PROCALCITON, LATICACIDVEN in the last 168 hours.  Recent Results (from the past 240 hour(s))  MRSA PCR Screening     Status: None   Collection Time: 03/21/16 12:09 AM  Result Value Ref Range Status   MRSA by PCR NEGATIVE NEGATIVE Final    Comment:        The GeneXpert MRSA Assay (FDA approved for NASAL specimens only), is one component of a comprehensive MRSA colonization surveillance program.  It is not intended to diagnose MRSA infection nor to guide or monitor treatment for MRSA infections.          Radiology Studies: Dg Chest 2 View  Result Date: 03/20/2016 CLINICAL DATA:  55 year old male with chest pain EXAM: CHEST  2 VIEW COMPARISON:  Chest radiograph dated 03/09/2016 FINDINGS: The heart size and mediastinal contours are within normal limits. Both lungs are clear. The visualized skeletal structures are unremarkable. IMPRESSION: No active cardiopulmonary disease. Electronically Signed   By: Elgie Collard M.D.   On: 03/20/2016 22:20        Scheduled Meds: . amLODipine  10 mg Oral q morning - 10a  . aspirin  81 mg Oral q morning - 10a  . carvedilol  50 mg Oral BID WC  . cloNIDine  0.3 mg Oral Q6H  . enoxaparin (LOVENOX) injection  40 mg Subcutaneous QHS  . furosemide  40 mg Oral Daily  . gabapentin  300 mg Oral TID  . hydrALAZINE  100 mg Oral TID  . insulin aspart  0-9 Units Subcutaneous TID WC  . insulin glargine  24 Units  Subcutaneous QHS  . isosorbide mononitrate  60 mg Oral Daily  . lisinopril  40 mg Oral q morning - 10a  . pantoprazole  20 mg Oral q morning - 10a  . pravastatin  40 mg Oral QHS  . ranolazine  1,000 mg Oral Q12H  . spironolactone  50 mg Oral Daily   Continuous Infusions: . nitroGLYCERIN 15 mcg/min (03/22/16 0234)     LOS: 2 days    Time spent: 35 minutes    Bennett Scrape, MD [facility] Pager [phone]  If 7PM-7AM, please contact night-coverage [email or web address] Password [email or web address] 03/22/2016, 8:04 AM

## 2016-03-23 LAB — GLUCOSE, CAPILLARY
GLUCOSE-CAPILLARY: 136 mg/dL — AB (ref 65–99)
GLUCOSE-CAPILLARY: 155 mg/dL — AB (ref 65–99)
Glucose-Capillary: 125 mg/dL — ABNORMAL HIGH (ref 65–99)
Glucose-Capillary: 102 mg/dL — ABNORMAL HIGH (ref 65–99)

## 2016-03-23 MED ORDER — HYDROCHLOROTHIAZIDE 50 MG PO TABS: 50.0000 mg | ORAL_TABLET | Freq: Every day | ORAL | Status: DC

## 2016-03-23 MED ORDER — CARVEDILOL 25 MG PO TABS
50.0000 mg | ORAL_TABLET | Freq: Two times a day (BID) | ORAL | Status: DC
  Administered 2016-03-24 – 2016-03-28 (×9): 50 mg via ORAL
  Filled 2016-03-23 (×10): qty 2

## 2016-03-23 MED ORDER — CHLORTHALIDONE 50 MG PO TABS
50.0000 mg | ORAL_TABLET | Freq: Every day | ORAL | Status: DC
  Administered 2016-03-23 – 2016-03-28 (×6): 50 mg via ORAL
  Filled 2016-03-23 (×6): qty 1

## 2016-03-23 NOTE — Progress Notes (Signed)
Patient is refusing to take medications until [place] [facility] [employer]. Refusing any other food at this time. Meal voucher used as pt did not eat his dinner tray per patient and prison guard at bedside.

## 2016-03-23 NOTE — Progress Notes (Signed)
    Subjective:  No chest pain or shortness of breath. Complains of a headache.  Objective:  Vital Signs in the last 24 hours: Temp:  [98 F (36.7 C)-98.7 F (37.1 C)] 98.5 F (36.9 C) (10/06 1243) Pulse Rate:  [53-72] 55 (10/06 1243) Resp:  [10-21] 16 (10/06 1243) BP: (143-194)/(75-132) 157/118 (10/06 1243) SpO2:  [81 %-100 %] 96 % (10/06 1243)  Intake/Output from previous day: 10/05 0701 - 10/06 0700 In: 1681 [P.O.:1540; I.V.:141] Out: 2600 [Urine:2600]  Physical Exam: Pt is alert and oriented, NAD HEENT: normal Neck: JVP - normal Lungs: CTA bilaterally CV: RRR with an S4 gallop Abd: soft, NT, Positive BS, no hepatomegaly Ext: no C/C/E, distal pulses intact and equal Skin: warm/dry no rash   Lab Results:  Recent Labs  03/20/16 1910  WBC 6.3  HGB 15.2  PLT 172    Recent Labs  03/20/16 1910  NA 136  K 4.0  CL 103  CO2 24  GLUCOSE 180*  BUN 7  CREATININE 1.14    Recent Labs  03/21/16 0450 03/21/16 1031  TROPONINI <0.03 <0.03   Tele: Personally reviewed. Sinus rhythm  Assessment/Plan:  1. Hypertensive emergency: BP improving 2. Nonobstructive CAD  Complex regimen includes: minoxidil, lisinopril, aldactone, coreg, amlodipine, hydralazine, imdur, lasix, clonidine. Will stop lasix and start chlorthalidone 25 mg daily. [facility] Not much else to offer from medication standpoint. Please call if we can be of further help. As long as his diastolic BP is less than 100 mmHg, he can probably be discharged tomorrow.   Tonny Bollmanooper, Lynda Wanninger, M.D. 03/23/2016, 1:39 PM

## 2016-03-23 NOTE — Progress Notes (Signed)
PROGRESS NOTE    Raymond Crane  ZOX:[id number] DOB: Apr 02, 1961 DOA: 03/20/2016 PCP: Berna Bue, MD    Brief Narrative:  Raymond Crane is a 55 y.o. male with medical history significant of hypertension, hyperlipidemia, diabetes mellitus, GERD, PE 2013, CABG, myocardial infarction, who presents with chest pain and elevated blood pressure. Pt states that his chest pain started yesterday morning. His chest is located in the left septal chest, constant, 8 out of 10 in severity, pressure-like, radiating to the left arm. It is not aggravated or alleviated by any known factors. He was found to have elevated blood pressure of SBP 230 per EMS. He states that he is taking 7 different types of blood pressure medications, and has been compliant to medications. He states that he took all his medications today. Patient also complains of headache over occipital area, which is constant, 8 out of 10 in severity, nonradiating. No unilateral weakness, numbness or tingling in extremities. No vision change or hearing loss. Patient does not have nausea, vomiting, diarrhea, abdominal pain, symptoms of UTI. No fever, chills, cough. Pt was given one dose of clonidine, nitroglycerin patch in ED. Labetalol drip was ordered, but not started yet in ED. Patient was found to have negative troponin, BNP 21.7, WBC 6.3, creatinine 1.14, temperature normal, bradycardia. Negative chest x-ray. Patient is admitted to stepdown as inpatient.  Cardiology was consulted for assistance in hypertensive urgency management.   Assessment & Plan:   Principal Problem:   Hypertensive urgency Active Problems:   Type 2 diabetes mellitus, uncontrolled (HCC)   CAD - minor CAD at cath Jan 2013   HLD (hyperlipidemia)   GERD (gastroesophageal reflux disease)   Chest pain  Hypertensive urgency - pt sates that he has been compliant to his outpatient oral blood pressure medications - presented with hypertensive urgency with blood pressure 230/160 and  chest pain - Cardiology consulted - switched from labetolol drip to nitroglycerin drip 2/2 bradycardia (still on Nitro drip) - continue home medications of amlodipine, carvedilol, clonidine, furosemide, hydralazine, Isosorbide Mononitrate, Spironolactone - per cardiology will increase carvedilol to 50mg  daily - minoxidil 10mg  ordered - on max dose of some BP medications - Acetaminophen PRN for headache and ordered Norco 5/325 q8 PRN severe pain  DM-II:  - Last A1c 11.2 on 11/08/15  - HgA1c 8.3 on this admission which is much improved -Lantus dose 24 units daily  -SSI - follow CBG measurements (highest appears to be 189)  HLD:  - Last LDL was 127 on 03/21/16  -Continue pravastatin  GERD: -Protonix  Chest pain and hx of CAD - minor CAD at cath Jan 2013:  - CP is most likely due to demanding ischemia secondary to hypertensive urgency - Initial troponin negative - ST depression noted on EKG - Cardiology consulted - Troponin negative x 3 - Nitroglycerin gtt as above - Prn Morphine, and aspirin, pravastatin - Echocardiogram normal (performed yesterday)  DVT ppx: SQ Lovenox Code Status: Full code Family Communication: None at bed side.  Disposition Plan:  Anticipate discharge back to previous jail environment when blood pressure better controlled on oral agents Consults called: cardiology Admission status: SDU/inpatient- can transfer to medical floor when off nitro drip   Consultants:   Cardiology  Procedures:   Echocardiogram  Antimicrobials:   none    Subjective: Patient asleep at time of exam.  When awoken states he slept well last night.  Chest pain is improved.  Headache is significantly improved. Heart rate did drop into the mid 50s overnight.  Nitro drip still running.  Patient voices that he understands he is on max dose of a few blood pressure medications.      Objective: Vitals:   03/23/16 0000 03/23/16 0059 03/23/16 0333 03/23/16 0600  BP:  (!)  175/75  (!) 143/88  Pulse:      Resp:      Temp: 98 F (36.7 C)  98.1 F (36.7 C)   TempSrc: Oral  Tympanic   SpO2:        Intake/Output Summary (Last 24 hours) at 03/23/16 1610 Last data filed at 03/22/16 2300  Gross per 24 hour  Intake          1680.95 ml  Output             2600 ml  Net          -919.05 ml   There were no vitals filed for this visit.  Examination:  General exam: Appears calm and comfortable  Respiratory system: Clear to auscultation. Respiratory effort normal. Cardiovascular system: S1 & S2 heard, RRR. II/VI systolic murmur heard best at apex, No JVD, murmurs, rubs, or clicks. No pedal edema. Gastrointestinal system: Abdomen is nondistended, soft and nontender. No organomegaly or masses felt. Normal bowel sounds heard. Central nervous system: Alert and oriented. No focal neurological deficits. Extremities: Symmetric 5 x 5 power. Skin: No rashes, lesions or ulcers Psychiatry: Judgement and insight appear normal. Mood & affect appropriate.     Data Reviewed: I have personally reviewed following labs and imaging studies  CBC:  Recent Labs Lab 03/20/16 1910  WBC 6.3  HGB 15.2  HCT 46.4  MCV 85.6  PLT 172   Basic Metabolic Panel:  Recent Labs Lab 03/20/16 1910  NA 136  K 4.0  CL 103  CO2 24  GLUCOSE 180*  BUN 7  CREATININE 1.14  CALCIUM 9.3  MG 1.9   GFR: CrCl cannot be calculated (Unknown ideal weight.). Liver Function Tests: No results for input(s): AST, ALT, ALKPHOS, BILITOT, PROT, ALBUMIN in the last 168 hours. No results for input(s): LIPASE, AMYLASE in the last 168 hours. No results for input(s): AMMONIA in the last 168 hours. Coagulation Profile: No results for input(s): INR, PROTIME in the last 168 hours. Cardiac Enzymes:  Recent Labs Lab 03/21/16 0048 03/21/16 0450 03/21/16 1031  TROPONINI <0.03 <0.03 <0.03   BNP (last 3 results) No results for input(s): PROBNP in the last 8760 hours. HbA1C:  Recent Labs   03/21/16 0450  HGBA1C 8.3*   CBG:  Recent Labs Lab 03/21/16 2058 03/22/16 0832 03/22/16 1327 03/22/16 1643 03/22/16 2132  GLUCAP 149* 99 160* 178* 101*   Lipid Profile:  Recent Labs  03/21/16 0450  CHOL 187  HDL 37*  LDLCALC 127*  TRIG 116  CHOLHDL 5.1   Thyroid Function Tests: No results for input(s): TSH, T4TOTAL, FREET4, T3FREE, THYROIDAB in the last 72 hours. Anemia Panel: No results for input(s): VITAMINB12, FOLATE, FERRITIN, TIBC, IRON, RETICCTPCT in the last 72 hours. Sepsis Labs: No results for input(s): PROCALCITON, LATICACIDVEN in the last 168 hours.  Recent Results (from the past 240 hour(s))  MRSA PCR Screening     Status: None   Collection Time: 03/21/16 12:09 AM  Result Value Ref Range Status   MRSA by PCR NEGATIVE NEGATIVE Final    Comment:        The GeneXpert MRSA Assay (FDA approved for NASAL specimens only), is one component of a comprehensive MRSA colonization surveillance program. It is not  intended to diagnose MRSA infection nor to guide or monitor treatment for MRSA infections.          Radiology Studies: No results found.      Scheduled Meds: . amLODipine  10 mg Oral q morning - 10a  . aspirin  81 mg Oral q morning - 10a  . carvedilol  50 mg Oral BID WC  . cloNIDine  0.3 mg Oral Q6H  . enoxaparin (LOVENOX) injection  40 mg Subcutaneous QHS  . furosemide  40 mg Oral Daily  . gabapentin  300 mg Oral TID  . hydrALAZINE  100 mg Oral TID  . insulin aspart  0-9 Units Subcutaneous TID WC  . insulin glargine  24 Units Subcutaneous QHS  . isosorbide mononitrate  60 mg Oral Daily  . lisinopril  40 mg Oral q morning - 10a  . minoxidil  10 mg Oral Daily  . pantoprazole  20 mg Oral q morning - 10a  . pravastatin  40 mg Oral QHS  . ranolazine  1,000 mg Oral Q12H  . spironolactone  50 mg Oral Daily   Continuous Infusions: . nitroGLYCERIN 5 mcg/min (03/23/16 0505)     LOS: 3 days    Time spent: 30 minutes    Bennett ScrapeAlex  Ukleja, MD [facility] Pager [phone]  If 7PM-7AM, please contact night-coverage [email or web address] Password [email or web address] 03/23/2016, 7:22 AM

## 2016-03-23 NOTE — Progress Notes (Signed)
Pts nitroglycerin drip paused per protocol due to pts HR dropping below 60BPM. TRH and Cards consulted to discontinue drip. Pt has no c/o pain, shortness of breath or chest pain. Pt is asleep but easily arousable. Per Fellow with cardiology keep nitro gtt running at 655mcg/min. Stated concern with pts HR and told to keep running nitro gtt at 255mcg/min.

## 2016-03-24 LAB — GLUCOSE, CAPILLARY
GLUCOSE-CAPILLARY: 156 mg/dL — AB (ref 65–99)
GLUCOSE-CAPILLARY: 122 mg/dL — AB (ref 65–99)
Glucose-Capillary: 123 mg/dL — ABNORMAL HIGH (ref 65–99)
Glucose-Capillary: 227 mg/dL — ABNORMAL HIGH (ref 65–99)
Glucose-Capillary: 91 mg/dL (ref 65–99)

## 2016-03-24 NOTE — Plan of Care (Signed)
Problem: Education: Goal: Knowledge of  General Education information/materials will improve Outcome: Progressing Discussed the high blood pressure medication and some of its side effects with some teach back displayed

## 2016-03-24 NOTE — Plan of Care (Addendum)
Problem: Nutrition: Goal: Adequate nutrition will be maintained Outcome: Progressing Patient is on a carb modified/cardiac diet.  Explained to patient that he gets three balanced meals a day and a snack as diabetic to control his blood sugar throughout the day with teach back not displayed.  Patient may need more diabetic education and not receiving [employer] every night besides his three meals. Blood sugar checked and was 156.  No extra snack needed at this time

## 2016-03-24 NOTE — Progress Notes (Signed)
Patient did receive sandwich from the floor stock for diabetes since [place] [employer]  He was complaining the sandwich was spoiled which two other RN's verified it smelled fine with an expiration date of 03/25/16 from the kitchen.  He also got a Glucerna and graham crackers.  Still trying to explain that he needs to order his meals and not skip his day meals which are his choice.

## 2016-03-24 NOTE — Progress Notes (Signed)
PROGRESS NOTE    Raymond LickJames Cassels  OZH:[id number] DOB: 03-16-1961 DOA: 03/20/2016 PCP: Berna BueHAYNES,GREGORY, MD    Brief Narrative:  Raymond Crane is a 55 y.o. male with medical history significant of hypertension, hyperlipidemia, diabetes mellitus, GERD, PE 2013, CABG, myocardial infarction, who presents with chest pain and elevated blood pressure. Pt states that his chest pain started yesterday morning. His chest is located in the left septal chest, constant, 8 out of 10 in severity, pressure-like, radiating to the left arm. It is not aggravated or alleviated by any known factors. He was found to have elevated blood pressure of SBP 230 per EMS. He states that he is taking 7 different types of blood pressure medications, and has been compliant to medications. He states that he took all his medications today. Patient also complains of headache over occipital area, which is constant, 8 out of 10 in severity, nonradiating. No unilateral weakness, numbness or tingling in extremities. No vision change or hearing loss. Patient does not have nausea, vomiting, diarrhea, abdominal pain, symptoms of UTI. No fever, chills, cough. Pt was given one dose of clonidine, nitroglycerin patch in ED. Labetalol drip was ordered, but not started yet in ED. Patient was found to have negative troponin, BNP 21.7, WBC 6.3, creatinine 1.14, temperature normal, bradycardia. Negative chest x-ray. Patient is admitted to stepdown as inpatient.  Cardiology was consulted for assistance in hypertensive urgency management.   Assessment & Plan:   Principal Problem:   Hypertensive urgency Active Problems:   Type 2 diabetes mellitus, uncontrolled (HCC)   CAD - minor CAD at cath Jan 2013   HLD (hyperlipidemia)   GERD (gastroesophageal reflux disease)   Chest pain  Hypertensive urgency - pt sates that he has been compliant to his outpatient oral blood pressure medications - presented with hypertensive urgency with blood pressure 230/160 and  chest pain - Cardiology consulted - switched from labetolol drip to nitroglycerin drip 2/2 bradycardia (nitro drip back on this am) - continue home medications of amlodipine, carvedilol, clonidine, minoxidil, hydralazine, Isosorbide Mononitrate, Spironolactone - per cardiology will increase carvedilol to 50mg  daily - minoxidil 10mg  ordered - start chlorthalidone 25mg  daily - need to titrate off nitro drip - on max dose of some BP medications - Acetaminophen PRN for headache and ordered Norco 5/325 q8 PRN severe pain  DM-II:  - Last A1c 11.2 on 11/08/15  - HgA1c 8.3 on this admission which is much improved -Lantus dose 24 units daily  -SSI - follow CBG measurements - patient has poor insight into managing his diabetes  HLD:  - Last LDL was 127 on 03/21/16  -Continue pravastatin  GERD: -Protonix  Chest pain and hx of CAD - minor CAD at cath Jan 2013:  - CP is most likely due to demanding ischemia secondary to hypertensive urgency - Initial troponin negative - ST depression noted on EKG - Cardiology consulted - Troponin negative x 3 - Nitroglycerin to be titrated off - Prn Morphine, and aspirin, pravastatin - Echocardiogram normal  DVT ppx: SQ Lovenox Code Status: Full code Family Communication: None at bed side.  Disposition Plan:  Anticipate discharge back to previous jail environment when blood pressure better controlled on oral agents Consults called: cardiology Admission status: SDU/inpatient- can transfer to medical floor when off nitro drip   Consultants:   Cardiology  Procedures:   Echocardiogram  Antimicrobials:   none    Subjective: Patient very upset this morning.  Voices he hates the food here and is being forced to either  eat when he has a headache or not eat at all.  Patient states when he has a headache he does not want to eat and his head finally has stopped hurting around 10-11pm and the kitchen is closed at that time.  Patient voices he is  upset that all that was offered to him was [place] sandwich that barely had any [place] on it.  He wants to leave the hospital but given that he is in custody he cannot leave AMA.  He says he knows how to control his blood sugars at home and it is not how they are being controlled here in the hospital.  Nitroglycerin drip was turned back on this morning secondary to blood pressure of 164/125.  Objective: Vitals:   03/24/16 0652 03/24/16 0730 03/24/16 1100 03/24/16 1153  BP: (!) 164/125 (!) 161/124 (!) 155/99   Pulse: (!) 52 (!) 54  (!) 59  Resp: 11 15  12   Temp:  98.2 F (36.8 C)  97.5 F (36.4 C)  TempSrc:  Oral  Oral  SpO2: 95% 98%  96%  Weight:      Height:        Intake/Output Summary (Last 24 hours) at 03/24/16 1223 Last data filed at 03/24/16 1154  Gross per 24 hour  Intake           667.03 ml  Output             1400 ml  Net          -732.97 ml   Filed Weights   03/24/16 0000  Weight: 119.7 kg (263 lb 14.3 oz)    Examination:  General exam: Agitated  Respiratory system: Clear to auscultation. Respiratory effort normal. Cardiovascular system: S1 & S2 heard, RRR. II/VI systolic murmur heard best at apex, No JVD, murmurs, rubs, or clicks. No pedal edema. Gastrointestinal system: Abdomen is nondistended, soft and nontender. No organomegaly or masses felt. Normal bowel sounds heard. Central nervous system: Alert and oriented. No focal neurological deficits. Extremities: Symmetric 5 x 5 power. Skin: No rashes, lesions or ulcers Psychiatry: Judgement and insight appear normal. Mood & affect appropriate.     Data Reviewed: I have personally reviewed following labs and imaging studies  CBC:  Recent Labs Lab 03/20/16 1910  WBC 6.3  HGB 15.2  HCT 46.4  MCV 85.6  PLT 172   Basic Metabolic Panel:  Recent Labs Lab 03/20/16 1910  NA 136  K 4.0  CL 103  CO2 24  GLUCOSE 180*  BUN 7  CREATININE 1.14  CALCIUM 9.3  MG 1.9   GFR: Estimated Creatinine Clearance:  99.5 mL/min (by C-G formula based on SCr of 1.14 mg/dL). Liver Function Tests: No results for input(s): AST, ALT, ALKPHOS, BILITOT, PROT, ALBUMIN in the last 168 hours. No results for input(s): LIPASE, AMYLASE in the last 168 hours. No results for input(s): AMMONIA in the last 168 hours. Coagulation Profile: No results for input(s): INR, PROTIME in the last 168 hours. Cardiac Enzymes:  Recent Labs Lab 03/21/16 0048 03/21/16 0450 03/21/16 1031  TROPONINI <0.03 <0.03 <0.03   BNP (last 3 results) No results for input(s): PROBNP in the last 8760 hours. HbA1C: No results for input(s): HGBA1C in the last 72 hours. CBG:  Recent Labs Lab 03/23/16 1649 03/23/16 2247 03/24/16 0353 03/24/16 0820 03/24/16 1151  GLUCAP 136* 155* 156* 123* 227*   Lipid Profile: No results for input(s): CHOL, HDL, LDLCALC, TRIG, CHOLHDL, LDLDIRECT in the last 72 hours. Thyroid  Function Tests: No results for input(s): TSH, T4TOTAL, FREET4, T3FREE, THYROIDAB in the last 72 hours. Anemia Panel: No results for input(s): VITAMINB12, FOLATE, FERRITIN, TIBC, IRON, RETICCTPCT in the last 72 hours. Sepsis Labs: No results for input(s): PROCALCITON, LATICACIDVEN in the last 168 hours.  Recent Results (from the past 240 hour(s))  MRSA PCR Screening     Status: None   Collection Time: 03/21/16 12:09 AM  Result Value Ref Range Status   MRSA by PCR NEGATIVE NEGATIVE Final    Comment:        The GeneXpert MRSA Assay (FDA approved for NASAL specimens only), is one component of a comprehensive MRSA colonization surveillance program. It is not intended to diagnose MRSA infection nor to guide or monitor treatment for MRSA infections.          Radiology Studies: No results found.      Scheduled Meds: . amLODipine  10 mg Oral q morning - 10a  . aspirin  81 mg Oral q morning - 10a  . carvedilol  50 mg Oral BID WC  . chlorthalidone  50 mg Oral Daily  . cloNIDine  0.3 mg Oral Q6H  . enoxaparin  (LOVENOX) injection  40 mg Subcutaneous QHS  . gabapentin  300 mg Oral TID  . hydrALAZINE  100 mg Oral TID  . insulin aspart  0-9 Units Subcutaneous TID WC  . insulin glargine  24 Units Subcutaneous QHS  . isosorbide mononitrate  60 mg Oral Daily  . lisinopril  40 mg Oral q morning - 10a  . minoxidil  10 mg Oral Daily  . pantoprazole  20 mg Oral q morning - 10a  . pravastatin  40 mg Oral QHS  . ranolazine  1,000 mg Oral Q12H  . spironolactone  50 mg Oral Daily   Continuous Infusions: . nitroGLYCERIN 5 mcg/min (03/24/16 0654)     LOS: 4 days    Time spent: 30 minutes    Bennett Scrape, MD [facility] Pager [phone]  If 7PM-7AM, please contact night-coverage [email or web address] Password [email or web address] 03/24/2016, 12:23 PM

## 2016-03-24 NOTE — Plan of Care (Signed)
Problem: Nutrition: Goal: Adequate nutrition will be maintained Outcome: Progressing Patient only asked for extra snacks from staff one time today.   Problem: Bowel/Gastric: Goal: Will not experience complications related to bowel motility Outcome: Progressing Patient had normal BMx1 today.

## 2016-03-25 DIAGNOSIS — I161 Hypertensive emergency: Secondary | ICD-10-CM

## 2016-03-25 LAB — CBC WITH DIFFERENTIAL/PLATELET
BASOS PCT: 1 %
Basophils Absolute: 0 10*3/uL (ref 0.0–0.1)
EOS ABS: 0.2 10*3/uL (ref 0.0–0.7)
Eosinophils Relative: 3 %
HCT: 45.5 % (ref 39.0–52.0)
HEMOGLOBIN: 15.4 g/dL (ref 13.0–17.0)
Lymphocytes Relative: 18 %
Lymphs Abs: 1.1 10*3/uL (ref 0.7–4.0)
MCH: 28.7 pg (ref 26.0–34.0)
MCHC: 33.8 g/dL (ref 30.0–36.0)
MCV: 84.9 fL (ref 78.0–100.0)
MONOS PCT: 6 %
Monocytes Absolute: 0.4 10*3/uL (ref 0.1–1.0)
NEUTROS PCT: 72 %
Neutro Abs: 4.5 10*3/uL (ref 1.7–7.7)
PLATELETS: 152 10*3/uL (ref 150–400)
RBC: 5.36 MIL/uL (ref 4.22–5.81)
RDW: 14.3 % (ref 11.5–15.5)
WBC: 6.1 10*3/uL (ref 4.0–10.5)

## 2016-03-25 LAB — TROPONIN I

## 2016-03-25 LAB — COMPREHENSIVE METABOLIC PANEL
ALK PHOS: 73 U/L (ref 38–126)
ALT: 18 U/L (ref 17–63)
AST: 18 U/L (ref 15–41)
Albumin: 4 g/dL (ref 3.5–5.0)
Anion gap: 8 (ref 5–15)
BUN: 11 mg/dL (ref 6–20)
CALCIUM: 9.7 mg/dL (ref 8.9–10.3)
CO2: 29 mmol/L (ref 22–32)
CREATININE: 1.14 mg/dL (ref 0.61–1.24)
Chloride: 98 mmol/L — ABNORMAL LOW (ref 101–111)
Glucose, Bld: 178 mg/dL — ABNORMAL HIGH (ref 65–99)
Potassium: 4 mmol/L (ref 3.5–5.1)
SODIUM: 135 mmol/L (ref 135–145)
Total Bilirubin: 0.4 mg/dL (ref 0.3–1.2)
Total Protein: 7.3 g/dL (ref 6.5–8.1)

## 2016-03-25 LAB — GLUCOSE, CAPILLARY
GLUCOSE-CAPILLARY: 136 mg/dL — AB (ref 65–99)
GLUCOSE-CAPILLARY: 108 mg/dL — AB (ref 65–99)
Glucose-Capillary: 157 mg/dL — ABNORMAL HIGH (ref 65–99)
Glucose-Capillary: 99 mg/dL (ref 65–99)
Glucose-Capillary: 154 mg/dL — ABNORMAL HIGH (ref 65–99)

## 2016-03-25 LAB — TSH: TSH: 0.948 u[IU]/mL (ref 0.350–4.500)

## 2016-03-25 MED ORDER — MINOXIDIL 10 MG PO TABS
10.0000 mg | ORAL_TABLET | Freq: Two times a day (BID) | ORAL | Status: DC
  Administered 2016-03-25 – 2016-03-28 (×6): 10 mg via ORAL
  Filled 2016-03-25 (×6): qty 1

## 2016-03-25 MED ORDER — GABAPENTIN 400 MG PO CAPS
400.0000 mg | ORAL_CAPSULE | Freq: Three times a day (TID) | ORAL | Status: DC
Start: 2016-03-25 — End: 2016-03-28
  Administered 2016-03-25 – 2016-03-28 (×10): 400 mg via ORAL
  Filled 2016-03-25 (×10): qty 1

## 2016-03-25 MED ORDER — MORPHINE SULFATE (PF) 2 MG/ML IV SOLN
2.0000 mg | Freq: Once | INTRAVENOUS | Status: AC
  Administered 2016-03-25: 2 mg via INTRAVENOUS

## 2016-03-25 NOTE — Progress Notes (Signed)
PROGRESS NOTE    Raymond LickJames Tafolla  ZOX:[id number] DOB: 09-24-1960 DOA: 03/20/2016 PCP: Berna BueHAYNES,GREGORY, MD    Brief Narrative:  Raymond Crane is a 55 y.o. male with medical history significant of hypertension, hyperlipidemia, diabetes mellitus, GERD, PE 2013, CABG, myocardial infarction, who presents with chest pain and elevated blood pressure. Pt states that his chest pain started yesterday morning. His chest is located in the left septal chest, constant, 8 out of 10 in severity, pressure-like, radiating to the left arm. It is not aggravated or alleviated by any known factors. He was found to have elevated blood pressure of SBP 230 per EMS. He states that he is taking 7 different types of blood pressure medications, and has been compliant to medications. He states that he took all his medications today. Patient also complains of headache over occipital area, which is constant, 8 out of 10 in severity, nonradiating. No unilateral weakness, numbness or tingling in extremities. No vision change or hearing loss. Patient does not have nausea, vomiting, diarrhea, abdominal pain, symptoms of UTI. No fever, chills, cough. Pt was given one dose of clonidine, nitroglycerin patch in ED. Labetalol drip was ordered, but not started yet in ED. Patient was found to have negative troponin, BNP 21.7, WBC 6.3, creatinine 1.14, temperature normal, bradycardia. Negative chest x-ray. Patient is admitted to stepdown as inpatient.  Cardiology was consulted for assistance in hypertensive urgency management.  Patient on max dose of most medication and still requiring nitroglycerin drip (currently at 4410mcg/min).   Assessment & Plan:   Principal Problem:   Hypertensive urgency Active Problems:   Type 2 diabetes mellitus, uncontrolled (HCC)   CAD - minor CAD at cath Jan 2013   HLD (hyperlipidemia)   GERD (gastroesophageal reflux disease)   Chest pain  Hypertensive urgency - pt sates that he has been compliant to his outpatient  oral blood pressure medications - presented with hypertensive urgency with blood pressure 230/160 and chest pain - Cardiology consulted - switched from labetolol drip to nitroglycerin drip 2/2 bradycardia (nitro drip back on this am) - continue home medications of amlodipine, carvedilol, clonidine, minoxidil, hydralazine, Isosorbide Mononitrate, Spironolactone - per cardiology will increase carvedilol to 50mg  daily - minoxidil 10mg  ordered - start chlorthalidone 50mg  daily - need to titrate off nitro drip - on max dose of some BP medications - Acetaminophen PRN for headache and ordered Norco 5/325 q8 PRN severe pain - unable to get patients blood pressure controlled despite almost maximum pharmacologic therapy  DM-II:  - Last A1c 11.2 on 11/08/15  - HgA1c 8.3 on this admission which is much improved -Lantus dose 24 units daily  -SSI - follow CBG measurements (fasting glucose of 126 today) - patient has poor insight into managing his diabetes -   HLD:  - Last LDL was 127 on 03/21/16  -Continue pravastatin  GERD: -Protonix  Chest pain and hx of CAD - minor CAD at cath Jan 2013:  - CP is most likely due to demanding ischemia secondary to hypertensive urgency - ST depression noted on EKG - Cardiology consulted - Troponin negative x 3 - Nitroglycerin to be titrated off as tolerated (patient blood pressure has not allowed decrease weaning of nitroglycerin) - Prn Morphine, and aspirin, pravastatin - Echocardiogram normal - nitroglycerin currently at 3110mcg/min  DVT ppx: SQ Lovenox Code Status: Full code Family Communication: None at bed side.  Disposition Plan:  Anticipate discharge back to previous jail environment when blood pressure better controlled on oral agents Consults called: cardiology Admission  status: SDU/inpatient- can transfer to medical floor when off nitro drip   Consultants:   Cardiology  Procedures:   Echocardiogram  Antimicrobials:   none     Subjective: Patient sitting up in bed at time of exam.  Says he had a good night and that his headache is better but still there.  Says headache worse with the nitro drip on.  Denies chest pain.  Patient blood pressure during exam of 181/125.  Per vitals on the monitor diastolic has been in the 120s and systolic ranging from 170s to 161W. Nitroglycerin drip on at 42mcg/min.  Patient did say he ate minimally yesterday because he does not like the food here.    Objective: Vitals:   03/25/16 0500 03/25/16 0530 03/25/16 0600 03/25/16 0607  BP: (!) 134/99 (!) 157/102  (!) 162/90  Pulse: 70 83 78 65  Resp: 18 (!) 26 (!) 21 18  Temp:      TempSrc:      SpO2: 95% 94% 96% 95%  Weight:      Height:        Intake/Output Summary (Last 24 hours) at 03/25/16 0833 Last data filed at 03/25/16 0600  Gross per 24 hour  Intake          2206.13 ml  Output             2150 ml  Net            56.13 ml   Filed Weights   03/24/16 0000  Weight: 119.7 kg (263 lb 14.3 oz)    Examination:  General exam: calm, in no acute distress Respiratory system: Clear to auscultation. Respiratory effort normal. Cardiovascular system: S1 & S2 heard, RRR. II/VI systolic murmur heard best at apex, No JVD, murmurs, rubs, or clicks. No pedal edema. Gastrointestinal system: Abdomen is nondistended, soft and nontender. No organomegaly or masses felt. Normal bowel sounds heard. Central nervous system: Alert and oriented. No focal neurological deficits. Extremities: Symmetric 5 x 5 power. Skin: No rashes, lesions or ulcers Psychiatry: Judgement and insight appear normal. Mood & affect appropriate.     Data Reviewed: I have personally reviewed following labs and imaging studies  CBC:  Recent Labs Lab 03/20/16 1910  WBC 6.3  HGB 15.2  HCT 46.4  MCV 85.6  PLT 172   Basic Metabolic Panel:  Recent Labs Lab 03/20/16 1910  NA 136  K 4.0  CL 103  CO2 24  GLUCOSE 180*  BUN 7  CREATININE 1.14  CALCIUM  9.3  MG 1.9   GFR: Estimated Creatinine Clearance: 99.5 mL/min (by C-G formula based on SCr of 1.14 mg/dL). Liver Function Tests: No results for input(s): AST, ALT, ALKPHOS, BILITOT, PROT, ALBUMIN in the last 168 hours. No results for input(s): LIPASE, AMYLASE in the last 168 hours. No results for input(s): AMMONIA in the last 168 hours. Coagulation Profile: No results for input(s): INR, PROTIME in the last 168 hours. Cardiac Enzymes:  Recent Labs Lab 03/21/16 0048 03/21/16 0450 03/21/16 1031  TROPONINI <0.03 <0.03 <0.03   BNP (last 3 results) No results for input(s): PROBNP in the last 8760 hours. HbA1C: No results for input(s): HGBA1C in the last 72 hours. CBG:  Recent Labs Lab 03/24/16 1151 03/24/16 1734 03/24/16 2129 03/25/16 0502 03/25/16 0800  GLUCAP 227* 91 122* 136* 157*   Lipid Profile: No results for input(s): CHOL, HDL, LDLCALC, TRIG, CHOLHDL, LDLDIRECT in the last 72 hours. Thyroid Function Tests: No results for input(s): TSH, T4TOTAL,  FREET4, T3FREE, THYROIDAB in the last 72 hours. Anemia Panel: No results for input(s): VITAMINB12, FOLATE, FERRITIN, TIBC, IRON, RETICCTPCT in the last 72 hours. Sepsis Labs: No results for input(s): PROCALCITON, LATICACIDVEN in the last 168 hours.  Recent Results (from the past 240 hour(s))  MRSA PCR Screening     Status: None   Collection Time: 03/21/16 12:09 AM  Result Value Ref Range Status   MRSA by PCR NEGATIVE NEGATIVE Final    Comment:        The GeneXpert MRSA Assay (FDA approved for NASAL specimens only), is one component of a comprehensive MRSA colonization surveillance program. It is not intended to diagnose MRSA infection nor to guide or monitor treatment for MRSA infections.          Radiology Studies: No results found.      Scheduled Meds: . amLODipine  10 mg Oral q morning - 10a  . aspirin  81 mg Oral q morning - 10a  . carvedilol  50 mg Oral BID WC  . chlorthalidone  50 mg Oral  Daily  . cloNIDine  0.3 mg Oral Q6H  . enoxaparin (LOVENOX) injection  40 mg Subcutaneous QHS  . gabapentin  400 mg Oral TID  . hydrALAZINE  100 mg Oral TID  . insulin aspart  0-9 Units Subcutaneous TID WC  . insulin glargine  24 Units Subcutaneous QHS  . isosorbide mononitrate  60 mg Oral Daily  . lisinopril  40 mg Oral q morning - 10a  . minoxidil  10 mg Oral Daily  . pantoprazole  20 mg Oral q morning - 10a  . pravastatin  40 mg Oral QHS  . ranolazine  1,000 mg Oral Q12H  . spironolactone  50 mg Oral Daily   Continuous Infusions: . nitroGLYCERIN 10 mcg/min (03/25/16 0622)     LOS: 5 days    Time spent: 30 minutes    Bennett Scrape, MD [facility] Pager [phone]  If 7PM-7AM, please contact night-coverage [email or web address] Password [facility] 03/25/2016, 8:33 AM

## 2016-03-25 NOTE — Progress Notes (Signed)
MD notified, manual BP elevated along with automatic BP. NTG increased to 3215mcg/min. Patient other VSS, c/o back pain treated with morphine. No other complaints at this time. Will continue to monitor closely.

## 2016-03-25 NOTE — Progress Notes (Signed)
Nitroglycerin paused due to loss of IV access from 1340 until 1515.

## 2016-03-25 NOTE — Progress Notes (Signed)
Pt C/O CP pressure radiating to neck and right arm. MD made aware. 12 lead EKG obtained: NSR. Manual BP 180/120.  MD ordered troponin x3 and add'l labs, one time morphine 2mg  dose, and to page if patient experiences SOB, or vision changes. Pt made aware of new plan. Will continue to monitor closely.

## 2016-03-25 NOTE — Progress Notes (Signed)
Patient Name: Raymond Crane Date of Encounter: 03/25/2016  Principal Problem:   Hypertensive urgency Active Problems:   Type 2 diabetes mellitus, uncontrolled (HCC)   CAD - minor CAD at cath Jan 2013   HLD (hyperlipidemia)   GERD (gastroesophageal reflux disease)   Chest pain   Length of Stay: 5  SUBJECTIVE  Back pain improved with morphine. No angina or dyspnea.  CURRENT MEDS . amLODipine  10 mg Oral q morning - 10a  . aspirin  81 mg Oral q morning - 10a  . carvedilol  50 mg Oral BID WC  . chlorthalidone  50 mg Oral Daily  . cloNIDine  0.3 mg Oral Q6H  . enoxaparin (LOVENOX) injection  40 mg Subcutaneous QHS  . gabapentin  400 mg Oral TID  . hydrALAZINE  100 mg Oral TID  . insulin aspart  0-9 Units Subcutaneous TID WC  . insulin glargine  24 Units Subcutaneous QHS  . isosorbide mononitrate  60 mg Oral Daily  . lisinopril  40 mg Oral q morning - 10a  . minoxidil  10 mg Oral BID  . pantoprazole  20 mg Oral q morning - 10a  . pravastatin  40 mg Oral QHS  . ranolazine  1,000 mg Oral Q12H  . spironolactone  50 mg Oral Daily    OBJECTIVE   Intake/Output Summary (Last 24 hours) at 03/25/16 1110 Last data filed at 03/25/16 0600  Gross per 24 hour  Intake          1846.13 ml  Output             1900 ml  Net           -53.87 ml   Filed Weights   03/24/16 0000  Weight: 263 lb 14.3 oz (119.7 kg)    PHYSICAL EXAM Vitals:   03/25/16 0800 03/25/16 0912 03/25/16 0917 03/25/16 0922  BP:  (!) 192/90 (!) 196/96 (!) 158/121  Pulse:  70 84 62  Resp:  16 (!) 25 18  Temp: 97.7 F (36.5 C)     TempSrc: Oral     SpO2:  92% 91% 93%  Weight:      Height:       General: Alert, oriented x3, no distress Head: no evidence of trauma, PERRL, EOMI, no exophtalmos or lid lag, no myxedema, no xanthelasma; normal ears, nose and oropharynx Neck: normal jugular venous pulsations and no hepatojugular reflux; brisk carotid pulses without delay and no carotid bruits Chest: clear to  auscultation, no signs of consolidation by percussion or palpation, normal fremitus, symmetrical and full respiratory excursions Cardiovascular: normal position and quality of the apical impulse, regular rhythm, normal first and second heart sounds, no rubs or murmur. S4 present. Abdomen: no tenderness or distention, no masses by palpation, no abnormal pulsatility or arterial bruits, normal bowel sounds, no hepatosplenomegaly Extremities: no clubbing, cyanosis or edema; 2+ radial, ulnar and brachial pulses bilaterally; 2+ right femoral, posterior tibial and dorsalis pedis pulses; 2+ left femoral, posterior tibial and dorsalis pedis pulses; no subclavian or femoral bruits Neurological: grossly nonfocal  LABS  CBC  Recent Labs  03/25/16 1025  WBC 6.1  NEUTROABS 4.5  HGB 15.4  HCT 45.5  MCV 84.9  PLT 152    Radiology Studies No renal artery stenosis by duplex US  TELE NSR/mild sinus bradycardia  ECG NSR, LVH  ASSESSMENT AND PLAN  Extremely severe HTN, still requiring IV meds despite high doses of carvedilol, clonidine, amlodipine, hydralazine, lisinopril and moderate  doses of chlorthalidone and spironolactone. Adrenergic blockers maxed out due to occasional bradycardia. Would continue to increase minoxidil dose and if necessary spironolactone.  Increased minoxidil to 10 mg BID and spironolactone to 50 mg BID today. IF BP drops, would stop IV NTG first and then reduce hydralazine gradually, with a plan to use minoxidil as the only direct vasodilator. He may need minoxidil 40-100 mg by the time titration is completed. Renin and aldosterone levels are ordered, but will be hard to interpret with current meds.   Thurmon FairMihai Cymone Yeske, MD, [facility] [phone] office [phone] pager 03/25/2016 11:10 AM

## 2016-03-25 NOTE — Plan of Care (Signed)
Problem: Health Behavior/Discharge Planning: Goal: Ability to manage health-related needs will improve Outcome: Progressing Discussed with patient the importance of adhering to medication schedule and adherence to managing diabetes

## 2016-03-25 NOTE — Progress Notes (Signed)
Patient complained of blood sugar being low, check showed a result of 136.  Explained to patient that his body is used to running at a higher than normal level blood sugar.  And with his body adjusting to the new normal he is going to feel like it is low at times with some teach back displayed but reinforcement education needed.

## 2016-03-25 NOTE — Plan of Care (Signed)
Problem: Education: Goal: Knowledge of [place] Education information/materials will improve Outcome: Progressing Discussed pain management and care plan for the evening with some teach back displayed.

## 2016-03-26 DIAGNOSIS — I25118 Atherosclerotic heart disease of native coronary artery with other forms of angina pectoris: Secondary | ICD-10-CM

## 2016-03-26 DIAGNOSIS — I160 Hypertensive urgency: Secondary | ICD-10-CM

## 2016-03-26 LAB — GLUCOSE, CAPILLARY
GLUCOSE-CAPILLARY: 120 mg/dL — AB (ref 65–99)
Glucose-Capillary: 172 mg/dL — ABNORMAL HIGH (ref 65–99)
Glucose-Capillary: 162 mg/dL — ABNORMAL HIGH (ref 65–99)
Glucose-Capillary: 118 mg/dL — ABNORMAL HIGH (ref 65–99)

## 2016-03-26 NOTE — Plan of Care (Signed)
Problem: Education: Goal: Knowledge of [place] [facility] Education information/materials will improve Outcome: Progressing Discussed plan of care concerning blood pressure drip and pain management tonight with the patient teach back displayed

## 2016-03-26 NOTE — Progress Notes (Signed)
    Vital signs reviewed today. Blood pressure appears to be stable. Can essentially DC nitroglycerin and continue current medications.  No further input from Dr. Erin Hearingroitoru's note from yesterday.  We'll sign off for now.   Bryan Lemmaavid Lexis Potenza, MD

## 2016-03-26 NOTE — Progress Notes (Signed)
PROGRESS NOTE    Raymond Crane  ZOX:[id number] DOB: August 16, 1960 DOA: 03/20/2016 PCP: Berna Bue, MD    Brief Narrative:  Raymond Crane is a 55 y.o. male with medical history significant of hypertension, hyperlipidemia, diabetes mellitus, GERD, PE 2013, CABG, myocardial infarction, who presents with chest pain and elevated blood pressure. Raymond Crane states that his chest pain started yesterday morning. His chest is located in the left septal chest, constant, 8 out of 10 in severity, pressure-like, radiating to the left arm. It is not aggravated or alleviated by any known factors. He was found to have elevated blood pressure of SBP 230 per EMS. He states that he is taking 7 different types of blood pressure medications, and has been compliant to medications. He states that he took all his medications today. Patient also complains of headache over occipital area, which is constant, 8 out of 10 in severity, nonradiating. No unilateral weakness, numbness or tingling in extremities. No vision change or hearing loss. Patient does not have nausea, vomiting, diarrhea, abdominal pain, symptoms of UTI. No fever, chills, cough. Raymond Crane was given one dose of clonidine, nitroglycerin patch in ED. Labetalol drip was ordered, but not started yet in ED. Patient was found to have negative troponin, BNP 21.7, WBC 6.3, creatinine 1.14, temperature normal, bradycardia. Negative chest x-ray. Patient is admitted to stepdown as inpatient.  Cardiology was consulted for assistance in hypertensive urgency management.  Patient on max dose of most medication and still requiring nitroglycerin drip (currently at 51mcg/min).   Assessment & Plan:   Principal Problem:   Hypertensive urgency Active Problems:   Type 2 diabetes mellitus, uncontrolled (HCC)   CAD - minor CAD at cath Jan 2013   HLD (hyperlipidemia)   GERD (gastroesophageal reflux disease)   Chest pain   Hypertensive emergency  Hypertensive urgency - Raymond Crane sates that he has been  compliant to his outpatient oral blood pressure medications - presented with hypertensive urgency with blood pressure 230/160 and chest pain - Cardiology consulted- appreciate their recommendations - switched from labetolol drip to nitroglycerin drip 2/2 bradycardia (nitro drip back on this am) - continue home medications of amlodipine, carvedilol, clonidine, minoxidil, hydralazine, Isosorbide Mononitrate, Spironolactone - carvedilol to 50mg  daily - increase minoxidil to 10mg  BID - increase chlorthalidone to 50mg  BID - need to titrate off nitro drip (currently at 53mcg/min) - on max dose of some BP medications - Acetaminophen PRN for headache and ordered Norco 5/325 q8 PRN severe pain  DM-II:  - Last A1c 11.2 on 11/08/15  - HgA1c 8.3 on this admission which is much improved -Lantus dose 24 units daily  -SSI - follow CBG measurements (fasting glucose not available today) - patient has poor insight into managing his diabetes  HLD:  - Last LDL was 127 on 03/21/16  -Continue pravastatin  GERD: -Protonix  Chest pain and hx of CAD - minor CAD at cath Jan 2013:  - CP is most likely due to demanding ischemia secondary to hypertensive urgency - ST depression noted on EKG - Cardiology consulted - Troponin negative x 3 (recent troponin again negative x 2) - Nitroglycerin to be titrated off as tolerated (patient blood pressure has not allowed decrease weaning of nitroglycerin) - Prn Morphine, and aspirin, pravastatin - Echocardiogram normal - nitroglycerin currently at 2mcg/min  DVT ppx: SQ Lovenox Code Status: Full code Family Communication: None at bed side.  Disposition Plan:  Anticipate discharge back to previous jail environment when blood pressure better controlled on oral agents (will need prescriptions for  blood pressure medications at discharge as patient to be released from jail in5 days) Consults called: cardiology Admission status: SDU/inpatient- can transfer to medical  floor when off nitro drip   Consultants:   Cardiology  Procedures:   Echocardiogram  Antimicrobials:   none    Subjective: Patient sitting at edge of bed and says he feels much better today.  His chest pain and headache are significantly better today.  He did have signifcant chest pain yesterday and repeat troponins were drawn (were negative).  Cardiologist did increase patients minoxidil and spironolactone yesterday.  Nitroglycerin has been titrated down to 662mcg/min.  Patient asking cost of medications when he ready to discharge.    Objective: Vitals:   03/26/16 0635 03/26/16 0700 03/26/16 0730 03/26/16 0737  BP: (!) 124/96   (!) 130/99  Pulse: (!) 58 (!) 54 (!) 59 (!) 57  Resp: 17 11 (!) 24 18  Temp:      TempSrc:      SpO2: (!) 89% 94% 93% (!) 84%  Weight:      Height:        Intake/Output Summary (Last 24 hours) at 03/26/16 0755 Last data filed at 03/26/16 [id number]  Gross per 24 hour  Intake          2245.05 ml  Output             2050 ml  Net           195.05 ml   Filed Weights   03/24/16 0000  Weight: 119.7 kg (263 lb 14.3 oz)    Examination:  General exam: calm, in no acute distress Respiratory system: Clear to auscultation. Respiratory effort normal. Cardiovascular system: S1 & S2 heard, RRR. II/VI systolic murmur heard best at apex, No JVD, murmurs, rubs, or clicks. No pedal edema. Gastrointestinal system: Abdomen is nondistended, soft and nontender. No organomegaly or masses felt. Normal bowel sounds heard. Central nervous system: Alert and oriented. No focal neurological deficits. Extremities: Symmetric 5 x 5 power. Skin: No rashes, lesions or ulcers Psychiatry: Judgement and insight appear normal. Mood & affect appropriate.     Data Reviewed: I have personally reviewed following labs and imaging studies  CBC:  Recent Labs Lab 03/20/16 1910 03/25/16 1025  WBC 6.3 6.1  NEUTROABS  --  4.5  HGB 15.2 15.4  HCT 46.4 45.5  MCV 85.6 84.9  PLT 172  152   Basic Metabolic Panel:  Recent Labs Lab 03/20/16 1910 03/25/16 1025  NA 136 135  K 4.0 4.0  CL 103 98*  CO2 24 29  GLUCOSE 180* 178*  BUN 7 11  CREATININE 1.14 1.14  CALCIUM 9.3 9.7  MG 1.9  --    GFR: Estimated Creatinine Clearance: 99.5 mL/min (by C-G formula based on SCr of 1.14 mg/dL). Liver Function Tests:  Recent Labs Lab 03/25/16 1025  AST 18  ALT 18  ALKPHOS 73  BILITOT 0.4  PROT 7.3  ALBUMIN 4.0   No results for input(s): LIPASE, AMYLASE in the last 168 hours. No results for input(s): AMMONIA in the last 168 hours. Coagulation Profile: No results for input(s): INR, PROTIME in the last 168 hours. Cardiac Enzymes:  Recent Labs Lab 03/21/16 0048 03/21/16 0450 03/21/16 1031 03/25/16 1149  TROPONINI <0.03 <0.03 <0.03 <0.03   BNP (last 3 results) No results for input(s): PROBNP in the last 8760 hours. HbA1C: No results for input(s): HGBA1C in the last 72 hours. CBG:  Recent Labs Lab 03/25/16 0502 03/25/16 0800  03/25/16 1322 03/25/16 1727 03/25/16 2145  GLUCAP 136* 157* 99 154* 108*   Lipid Profile: No results for input(s): CHOL, HDL, LDLCALC, TRIG, CHOLHDL, LDLDIRECT in the last 72 hours. Thyroid Function Tests:  Recent Labs  03/25/16 1025  TSH 0.948   Anemia Panel: No results for input(s): VITAMINB12, FOLATE, FERRITIN, TIBC, IRON, RETICCTPCT in the last 72 hours. Sepsis Labs: No results for input(s): PROCALCITON, LATICACIDVEN in the last 168 hours.  Recent Results (from the past 240 hour(s))  MRSA PCR Screening     Status: None   Collection Time: 03/21/16 12:09 AM  Result Value Ref Range Status   MRSA by PCR NEGATIVE NEGATIVE Final    Comment:        The GeneXpert MRSA Assay (FDA approved for NASAL specimens only), is one component of a comprehensive MRSA colonization surveillance program. It is not intended to diagnose MRSA infection nor to guide or monitor treatment for MRSA infections.          Radiology  Studies: No results found.      Scheduled Meds: . amLODipine  10 mg Oral q morning - 10a  . aspirin  81 mg Oral q morning - 10a  . carvedilol  50 mg Oral BID WC  . chlorthalidone  50 mg Oral Daily  . cloNIDine  0.3 mg Oral Q6H  . enoxaparin (LOVENOX) injection  40 mg Subcutaneous QHS  . gabapentin  400 mg Oral TID  . hydrALAZINE  100 mg Oral TID  . insulin aspart  0-9 Units Subcutaneous TID WC  . insulin glargine  24 Units Subcutaneous QHS  . isosorbide mononitrate  60 mg Oral Daily  . lisinopril  40 mg Oral q morning - 10a  . minoxidil  10 mg Oral BID  . pantoprazole  20 mg Oral q morning - 10a  . pravastatin  40 mg Oral QHS  . ranolazine  1,000 mg Oral Q12H  . spironolactone  50 mg Oral Daily   Continuous Infusions: . nitroGLYCERIN 2 mcg/min (03/26/16 0421)     LOS: 6 days    Time spent: 30 minutes    Bennett Scrape, MD [facility] Pager [phone]  If 7PM-7AM, please contact night-coverage [email or web address] Password [email or web address] 03/26/2016, 7:55 AM

## 2016-03-27 DIAGNOSIS — I161 Hypertensive emergency: Principal | ICD-10-CM

## 2016-03-27 LAB — GLUCOSE, CAPILLARY
GLUCOSE-CAPILLARY: 99 mg/dL (ref 65–99)
GLUCOSE-CAPILLARY: 116 mg/dL — AB (ref 65–99)
GLUCOSE-CAPILLARY: 172 mg/dL — AB (ref 65–99)
GLUCOSE-CAPILLARY: 171 mg/dL — AB (ref 65–99)

## 2016-03-27 MED ORDER — LIVING WELL WITH DIABETES BOOK
Freq: Once | Status: AC
  Administered 2016-03-27: 13:00:00
  Filled 2016-03-27 (×2): qty 1

## 2016-03-27 NOTE — Progress Notes (Signed)
Patient refusing another IV site, paged MD to let them know. Will continue to monitor. Patient has no access at this time.

## 2016-03-27 NOTE — Progress Notes (Addendum)
Inpatient Diabetes Program Recommendations  AACE/ADA: New Consensus Statement on Inpatient Glycemic Control (2015)  Target Ranges:  Prepandial:   less than 140 mg/dL      Peak postprandial:   less than 180 mg/dL (1-2 hours)      Critically ill patients:  140 - 180 mg/dL   Spoke with patient about diabetes and home regimen for diabetes control. Patient reports that he was taking insulins N and R at home before incarceration. NPH 39 units BID, Novolin R 10 units 4x/day, if glucose is >200 additional 2 units. Patient reports that he was taking insulin as prescribed. A1c (11.2% on 11/08/15 down to 8.3% on 03/21/16) improved since being on insulin regimen at incarcerated facility. Correction officer reports Facility may give 30 day supply at time of release (medication only, no glucose meter or BP cuff). Correction officer cannot guarantee that educational materials given to patient will be returned to him at time of release. It is not guaranteed that patient will be given a medication list at time of release as well. Directed patient to the ADA website for future educational needs. Reviewed A1c levels in the past and currently. Patient reported checking his glucose 4 times a day. Discussed glucose and A1C goals. Discussed importance of checking CBGs and maintaining good CBG control to prevent long-term and short-term complications. Explained how hyperglycemia leads to damage within blood vessels which lead to the common complications seen with uncontrolled diabetes. Discussed carbohydrates, carbohydrate goals per day and meal, along with portion sizes. Patient will discuss with sister since she will be preparing food where he will be staying. Patient will [place] staying and living in [place] away. Gave patient [employer] list for glucose supplies (insulins, meter, glucose tablets). Covered hypoglycemia symptoms and treatment. Patient verbalized understanding of information discussed and he states that he has  no further questions at this time related to diabetes.   Thanks,  Christena DeemShannon Karilynn Carranza RN, MSN, [facility] Inpatient Diabetes Coordinator Team Pager [phone] (8a-5p)

## 2016-03-27 NOTE — Care Management Note (Signed)
Case Management Note  Patient Details  Name: Raymond Crane MRN: [id number] Date of Birth: 10-31-1960  Subjective/Objective: Pt presented for Chest pain and  elevated blood pressure. Pt is a [name]. [name]. Plan will be to discharge back once stable. Plan was for pt to be released on Friday from the facility.                  Action/Plan: Pt is without insurance. Per Corporate treasurerCorrections Officer the [name] will provide pt some medication assistance at time of release. CM received referral for: Pt needs medication assistance, diabetic equipment assistance and a blood pressure cuff for new medications that he is on. CM will not be able to assist this person with medication at this time due to he will return to [name]. Diabetes Coordinator did speak with pt in regards to diabetes. Pt will be able to utilize [name] with the cost being no more than $25.00. CM unable to assist pt with bp cuff as well. Per pt he will be returning [place] [name] once d/c from the [name]. Pt will need to visit the local Health Department or Indigent Clinic for assistance. CM did go in to speak with pt to make him aware of plan however he was asleep. CM will touch base with pt before d/c. No further needs from CM at this time.   Expected Discharge Date:                  Expected Discharge Plan:  [facility]rections Facility  In-House Referral:  NA  Discharge planning Services  CM Consult  Post Acute Care Choice:  NA Choice offered to:  NA  DME Arranged:  N/A DME Agency:  NA  HH Arranged:  NA HH Agency:  NA  Status of Service:  Completed, signed off  If discussed at Long Length of Stay Meetings, dates discussed:    Additional Comments:  Raymond Crane, Raymond Kipper Kaye, RN 03/27/2016, 2:28 PM

## 2016-03-27 NOTE — Progress Notes (Addendum)
PROGRESS NOTE    Raymond Crane  UJW:[id number] DOB: Feb 19, 1961 DOA: 03/20/2016 PCP: Berna Bue, MD    Brief Narrative:  Raymond Crane is a 55 y.o. male with medical history significant of hypertension, hyperlipidemia, diabetes mellitus, GERD, PE 2013, CABG, myocardial infarction, who presents with chest pain and elevated blood pressure. Pt states that his chest pain started yesterday morning. His chest is located in the left septal chest, constant, 8 out of 10 in severity, pressure-like, radiating to the left arm. It is not aggravated or alleviated by any known factors. He was found to have elevated blood pressure of SBP 230 per EMS. He states that he is taking 7 different types of blood pressure medications, and has been compliant to medications. He states that he took all his medications today. Patient also complains of headache over occipital area, which is constant, 8 out of 10 in severity, nonradiating. No unilateral weakness, numbness or tingling in extremities. No vision change or hearing loss. Patient does not have nausea, vomiting, diarrhea, abdominal pain, symptoms of UTI. No fever, chills, cough. Pt was given one dose of clonidine, nitroglycerin patch in ED. Labetalol drip was ordered, but not started yet in ED. Patient was found to have negative troponin, BNP 21.7, WBC 6.3, creatinine 1.14, temperature normal, bradycardia. Negative chest x-ray. Patient is admitted to stepdown as inpatient.  Cardiology was consulted for assistance in hypertensive urgency management.  Patient on max dose of most medication was requiring nitroglycerin drip until 10/9.  Drip turned off and patient then transferred to telemetry to be monitored to ensure blood pressure stability prior to discharge.  Assessment & Plan:   Principal Problem:   Hypertensive urgency Active Problems:   Type 2 diabetes mellitus, uncontrolled (HCC)   CAD - minor CAD at cath Jan 2013   HLD (hyperlipidemia)   GERD (gastroesophageal  reflux disease)   Chest pain   Hypertensive emergency  Hypertensive urgency - pt sates that he has been compliant to his outpatient oral blood pressure medications - presented with hypertensive urgency with blood pressure 230/160 and chest pain - Cardiology consulted- appreciate their recommendations - switched from labetolol drip to nitroglycerin drip 2/2 bradycardia (nitro drip back on this am) - continue home medications of amlodipine, carvedilol, clonidine, minoxidil, hydralazine, Isosorbide Mononitrate, Spironolactone - carvedilol to 50mg  daily - increased minoxidil to 10mg  BID - can consider increasing chlorthalidone to 50mg  BID as well as increasing minoxidil - per cardiology goal is to slowly titrate off hydralazine and use minoxidil as only vasodilator (may require 40-100mg / day to control BP) - nitroglycerin drip off - on max dose of some BP medications - Acetaminophen PRN for headache and ordered Norco 5/325 q8 PRN severe pain - will transfer to telemetry to ensure patient blood pressure stable on PO medications prior to discharge  DM-II:  - Last A1c 11.2 on 11/08/15  - HgA1c 8.3 on this admission which is much improved -Lantus dose 24 units daily  -SSI - follow CBG measurements (fasting glucose high 90s today) - patient has poor insight into managing his diabetes  HLD:  - Last LDL was 127 on 03/21/16  -Continue pravastatin  GERD: -Protonix  Chest pain and hx of CAD - minor CAD at cath Jan 2013:  - CP is most likely due to demanding ischemia secondary to hypertensive urgency - ST depression noted on EKG - Cardiology consulted - Troponin negative x 3 (recent troponin again negative x 2) - Prn Morphine, and aspirin, pravastatin - Echocardiogram normal - nitroglycerin  off  DVT ppx: SQ Lovenox Code Status: Full code Family Communication: officer bedside, no family present for today Disposition Plan:  Anticipate discharge back to previous jail environment when  blood pressure better controlled on oral agents (will need prescriptions for blood pressure medications at discharge as patient to be released from jail in5 days)- anticipate discharge within the next 24 hours if blood pressure controlled on oral BP medications Consults called: cardiology Admission status: SDU/inpatient- transfer out of step down today   Consultants:   Cardiology  Procedures:   Echocardiogram  Antimicrobials:   none    Subjective:  Patient laying in bed.  Reports he feels good this morning.  Headache is minimal and no chest pain.  Reports that he feels like he is anxious to go home.  No shortness of breath, increased work of breathing, nausea, vomiting, diarrhea.  Patient reports he feels confident to take care of blood pressures and blood sugars outpatient.   Objective: Vitals:   03/27/16 0545 03/27/16 0600 03/27/16 0630 03/27/16 0743  BP: (!) 162/94 (!) 164/118 (!) 168/102 (!) 162/111  Pulse:  62 65 60  Resp:  15 14 16   Temp:    97.5 F (36.4 C)  TempSrc:    Oral  SpO2:  96% 97% 91%  Weight:      Height:        Intake/Output Summary (Last 24 hours) at 03/27/16 0817 Last data filed at 03/27/16 0300  Gross per 24 hour  Intake           966.58 ml  Output             2200 ml  Net         -1233.42 ml   Filed Weights   03/24/16 0000  Weight: 119.7 kg (263 lb 14.3 oz)    Examination:  General exam: calm, in no acute distress Respiratory system: Clear to auscultation. Respiratory effort normal. Cardiovascular system: S1 & S2 heard, RRR. II/VI systolic murmur heard best at apex, No JVD, murmurs, rubs, or clicks. No pedal edema. Gastrointestinal system: Abdomen is nondistended, soft and nontender. No organomegaly or masses felt. Normal bowel sounds heard. Central nervous system: Alert and oriented. No focal neurological deficits. Extremities: Symmetric 5 x 5 power. Skin: No rashes, lesions or ulcers Psychiatry: Judgement and insight appear normal. Mood  & affect appropriate.     Data Reviewed: I have personally reviewed following labs and imaging studies  CBC:  Recent Labs Lab 03/20/16 1910 03/25/16 1025  WBC 6.3 6.1  NEUTROABS  --  4.5  HGB 15.2 15.4  HCT 46.4 45.5  MCV 85.6 84.9  PLT 172 152   Basic Metabolic Panel:  Recent Labs Lab 03/20/16 1910 03/25/16 1025  NA 136 135  K 4.0 4.0  CL 103 98*  CO2 24 29  GLUCOSE 180* 178*  BUN 7 11  CREATININE 1.14 1.14  CALCIUM 9.3 9.7  MG 1.9  --    GFR: Estimated Creatinine Clearance: 99.5 mL/min (by C-G formula based on SCr of 1.14 mg/dL). Liver Function Tests:  Recent Labs Lab 03/25/16 1025  AST 18  ALT 18  ALKPHOS 73  BILITOT 0.4  PROT 7.3  ALBUMIN 4.0   No results for input(s): LIPASE, AMYLASE in the last 168 hours. No results for input(s): AMMONIA in the last 168 hours. Coagulation Profile: No results for input(s): INR, PROTIME in the last 168 hours. Cardiac Enzymes:  Recent Labs Lab 03/21/16 0048 03/21/16 0450 03/21/16 1031 03/25/16  1149  TROPONINI <0.03 <0.03 <0.03 <0.03   BNP (last 3 results) No results for input(s): PROBNP in the last 8760 hours. HbA1C: No results for input(s): HGBA1C in the last 72 hours. CBG:  Recent Labs Lab 03/26/16 0844 03/26/16 1259 03/26/16 1648 03/26/16 2154 03/27/16 0739  GLUCAP 172* 120* 162* 118* 99   Lipid Profile: No results for input(s): CHOL, HDL, LDLCALC, TRIG, CHOLHDL, LDLDIRECT in the last 72 hours. Thyroid Function Tests:  Recent Labs  03/25/16 1025  TSH 0.948   Anemia Panel: No results for input(s): VITAMINB12, FOLATE, FERRITIN, TIBC, IRON, RETICCTPCT in the last 72 hours. Sepsis Labs: No results for input(s): PROCALCITON, LATICACIDVEN in the last 168 hours.  Recent Results (from the past 240 hour(s))  MRSA PCR Screening     Status: None   Collection Time: 03/21/16 12:09 AM  Result Value Ref Range Status   MRSA by PCR NEGATIVE NEGATIVE Final    Comment:        The GeneXpert MRSA  Assay (FDA approved for NASAL specimens only), is one component of a comprehensive MRSA colonization surveillance program. It is not intended to diagnose MRSA infection nor to guide or monitor treatment for MRSA infections.          Radiology Studies: No results found.      Scheduled Meds: . amLODipine  10 mg Oral q morning - 10a  . aspirin  81 mg Oral q morning - 10a  . carvedilol  50 mg Oral BID WC  . chlorthalidone  50 mg Oral Daily  . cloNIDine  0.3 mg Oral Q6H  . enoxaparin (LOVENOX) injection  40 mg Subcutaneous QHS  . gabapentin  400 mg Oral TID  . hydrALAZINE  100 mg Oral TID  . insulin aspart  0-9 Units Subcutaneous TID WC  . insulin glargine  24 Units Subcutaneous QHS  . isosorbide mononitrate  60 mg Oral Daily  . lisinopril  40 mg Oral q morning - 10a  . minoxidil  10 mg Oral BID  . pantoprazole  20 mg Oral q morning - 10a  . pravastatin  40 mg Oral QHS  . ranolazine  1,000 mg Oral Q12H  . spironolactone  50 mg Oral Daily   Continuous Infusions: . nitroGLYCERIN Stopped (03/26/16 1658)     LOS: 7 days    Time spent: 30 minutes    Bennett ScrapeAlex Ukleja, MD [facility] Pager [phone]  If 7PM-7AM, please contact night-coverage [email or web address] Password [email or web address] 03/27/2016, 8:17 AM

## 2016-03-28 LAB — GLUCOSE, CAPILLARY
GLUCOSE-CAPILLARY: 122 mg/dL — AB (ref 65–99)
GLUCOSE-CAPILLARY: 106 mg/dL — AB (ref 65–99)

## 2016-03-28 MED ORDER — HYDROCODONE-ACETAMINOPHEN 5-325 MG PO TABS: 1.0000 | ORAL_TABLET | Freq: Four times a day (QID) | ORAL | 0 refills | Status: AC | PRN

## 2016-03-28 MED ORDER — CHLORTHALIDONE 50 MG PO TABS: 50.0000 mg | ORAL_TABLET | Freq: Every day | ORAL | 0 refills | Status: AC

## 2016-03-28 MED ORDER — INSULIN GLARGINE 100 UNIT/ML ~~LOC~~ SOLN: 24.0000 [IU] | Freq: Every day | SUBCUTANEOUS | 11 refills | Status: AC

## 2016-03-28 MED ORDER — ISOSORBIDE MONONITRATE ER 60 MG PO TB24: 60.0000 mg | ORAL_TABLET | Freq: Every day | ORAL | 0 refills | Status: AC

## 2016-03-28 MED ORDER — CARVEDILOL 25 MG PO TABS: 50.0000 mg | ORAL_TABLET | Freq: Two times a day (BID) | ORAL | 0 refills | Status: AC

## 2016-03-28 MED ORDER — MINOXIDIL 10 MG PO TABS: 10.0000 mg | ORAL_TABLET | Freq: Two times a day (BID) | ORAL | 0 refills | Status: AC

## 2016-03-28 NOTE — Progress Notes (Signed)
Patient discharged, discharge summary and prescriptions given to the Guard to take back to facility. No further questions at this time. Will continue to monitor.

## 2016-03-28 NOTE — Discharge Summary (Signed)
Physician Discharge Summary  Raymond Crane ZOX:[id number] DOB: November 28, 1960 DOA: 03/20/2016  PCP: Berna Bue, MD  Admit date: 03/20/2016 Discharge date: 03/28/2016  Ad[facility] From: [facility]  Disposition:  [facility]  Recommendations for Outpatient Follow-up:  1. Follow up with PCP Dr. Berna Bue in 1-2 weeks 2. Please obtain BMP/CBC in one week  Home Health: No Equipment/Devices: None  Discharge Condition: Stable and Improved.  CODE STATUS: FULL Diet recommendation: Heart Healthy/Carb Modified.   Brief/Interim Summary: Raymond Crane a 55 y.o.malewith medical history significant of hypertension, hyperlipidemia, diabetes mellitus, GERD, PE 2013, CABG, myocardial infarction, who presents with chest pain and elevated blood pressure. Pt states that his chest pain started 03/19/16 and was located in the left septal chest, constant, 8 out of 10 in severity, pressure-like, radiating to the left arm. It was not aggravated or alleviated by any known factors. He was found to have elevated blood pressure of SBP 230 per EMS. He stated that he is taking 7 different typesof blood pressure medications, and has been compliant to medications. He states that he took all his medications today. Patient also complained of headache over occipital area, which is constant, 8 out of 10 in severity, nonradiating. No unilateral weakness, numbness or tingling inextremities. Pt was given one dose of clonidine, nitroglycerin patch in ED. Labetalol drip was ordered, but not started yet in ED. Patient was found to have negative troponin, BNP 21.7, WBC 6.3, creatinine 1.14, temperature normal, bradycardia. Negative chest x-ray. Patient was admitted to stepdown as inpatient. Cardiology was consulted for assistance in hypertensive urgency management.  Patient on max dose of most medication was requiring nitroglycerin drip until 10/9.  Drip turned off and patient then transferred to telemetry to be monitored to ensure blood pressure  stability prior to discharge. This morning patient was doing well and had no issues and denied CP/SOB/N/V or lightheadedness or dizziness. Patient's BP was better controlled today and was deemed medically stable to be [facility].   Discharge Diagnoses:  Principal Problem:   Hypertensive urgency Active Problems:   Type 2 diabetes mellitus, uncontrolled (HCC)   CAD - minor CAD at cath Jan 2013   HLD (hyperlipidemia)   GERD (gastroesophageal reflux disease)   Chest pain   Hypertensive emergency  Hypertensive Urgency/Emergency, IMPROVED - pt stated that he has been compliant to his outpatient oral blood pressure medications - presented with hypertensive urgency with blood pressure 230/160 and chest pain - Cardiology consulted- appreciated their recommendations - switched from labetolol drip to nitroglycerin drip 2/2 bradycardia (nitro drip back on this am) - Continue home medications of amlodipine, clonidine hydralazine, Isosorbide Mononitrate, Spironolactone, Lisinopril - Changed to carvedilol to 50mg  daily - Increased minoxidil to 10mg  BID - C/w chlorthalidone to 50mg  BID; D/C'd Furosemide. - PER cardiology goal is to slowly titrate off hydralazine and use minoxidil as only vasodilator (may require 40-100mg / day to control Bp); Can be done as an outpatient  - on max dose of some BP medications - D/C'd Nitro gtt. - Follow up with Dr. Drinda Butts as an outpatient.   DM-II: - Last A1c 11.2 on 11/08/15  - HgA1c 8.3 on this admission which is much improved -C/w Lantus dose 24 units daily -SSI - follow CBG measurements (fasting glucose high 90s today) - patient has poor insight into managing his diabetes  HLD: - Last LDL was127 on 03/21/16 - Continue pravastatin  GERD: -Protonix  Chest pain and hx of CAD - minor CAD at cath Jan 2013; Improved - CP  wasmost likely due to demandingischemia secondary to hypertensive urgency - ST depression noted on EKG -  Cardiology consulted and appreciated recommendations - Troponin negative x 3 (recent troponin again negative x 2) - Prn Morphine, and aspirin, pravastatin - Echocardiogram normal - Nitroglycerin gtt discontinued.   Discharge Instructions  Discharge Instructions    Call MD for:  extreme fatigue    Complete by:  As directed    Call MD for:  persistant dizziness or light-headedness    Complete by:  As directed    Call MD for:  persistant nausea and vomiting    Complete by:  As directed    Diet - low sodium heart healthy    Complete by:  As directed    Discharge instructions    Complete by:  As directed    Take All medications as prescribed. If symptoms change or worsen please return to ER for Evaluation. Follow up with PCP in 1 week.   Increase activity slowly    Complete by:  As directed        Medication List    STOP taking these medications   furosemide 40 MG tablet Commonly known as:  LASIX     TAKE these medications   amLODipine 10 MG tablet Commonly known as:  NORVASC Take 10 mg by mouth every morning.   aspirin 81 MG chewable tablet Chew 81 mg by mouth every morning.   carvedilol 25 MG tablet Commonly known as:  COREG Take 2 tablets (50 mg total) by mouth 2 (two) times daily with a meal. What changed:  how much to take  when to take this   chlorthalidone 50 MG tablet Commonly known as:  HYGROTON Take 1 tablet (50 mg total) by mouth daily. Start taking on:  03/29/2016   cloNIDine 0.3 MG tablet Commonly known as:  CATAPRES Take 0.3 mg by mouth every 6 (six) hours. HOLD IF PULSE  <60   gabapentin 300 MG capsule Commonly known as:  NEURONTIN Take 300 mg by mouth 3 (three) times daily.   HUMULIN R 100 units/mL injection Generic drug:  insulin regular Inject 2-12 Units into the skin 3 (three) times daily before meals. Per sliding scale: 200-249 = 2 units; 250-299 = 4 units; 300-349 = 6 units; 350-399 = 8 units; 400-499 = 10 units; 500-549 = 12 units; call  provider if >550   hydrALAZINE 100 MG tablet Commonly known as:  APRESOLINE Take 100 mg by mouth 3 (three) times daily. What changed:  Another medication with the same name was removed. Continue taking this medication, and follow the directions you see here.   HYDROcodone-acetaminophen 5-325 MG tablet Commonly known as:  NORCO/VICODIN Take 1 tablet by mouth every 6 (six) hours as needed for severe pain.   insulin glargine 100 UNIT/ML injection Commonly known as:  LANTUS Inject 0.24 mLs (24 Units total) into the skin at bedtime. What changed:  how much to take   isosorbide mononitrate 60 MG 24 hr tablet Commonly known as:  IMDUR Take 1 tablet (60 mg total) by mouth daily. Start taking on:  03/29/2016 What changed:  medication strength  how much to take   lisinopril 40 MG tablet Commonly known as:  PRINIVIL,ZESTRIL Take 40 mg by mouth every morning.   metFORMIN 1000 MG tablet Commonly known as:  GLUCOPHAGE Take 1 tablet (1,000 mg total) by mouth 2 (two) times daily with a meal.   minoxidil 10 MG tablet Commonly known as:  LONITEN Take 1 tablet (  10 mg total) by mouth 2 (two) times daily.   nitroGLYCERIN 0.4 MG SL tablet Commonly known as:  NITROSTAT Place 1 tablet (0.4 mg total) under the tongue every 5 (five) minutes x 3 doses as needed for chest pain.   pantoprazole 20 MG tablet Commonly known as:  PROTONIX Take 20 mg by mouth every morning.   pravastatin 40 MG tablet Commonly known as:  PRAVACHOL Take 40 mg by mouth at bedtime.   ranolazine 500 MG 12 hr tablet Commonly known as:  RANEXA Take 1,000 mg by mouth every 12 (twelve) hours.   spironolactone 50 MG tablet Commonly known as:  ALDACTONE Take 1 tablet (50 mg total) by mouth daily.       No Known Allergies  Consultations:  CARDIOLOGY   Procedures/Studies: Dg Chest 2 View  Result Date: 03/20/2016 CLINICAL DATA:  55 year old male with chest pain EXAM: CHEST  2 VIEW COMPARISON:  Chest radiograph  dated 03/09/2016 FINDINGS: The heart size and mediastinal contours are within normal limits. Both lungs are clear. The visualized skeletal structures are unremarkable. IMPRESSION: No active cardiopulmonary disease. Electronically Signed   By: Elgie CollardArash  Radparvar M.D.   On: 03/20/2016 22:20     ECHOCARDIOGRAM Study Conclusions  - Left ventricle: The cavity size was normal. Wall thickness was   increased in a pattern of severe LVH. Systolic function was   normal. The estimated ejection fraction was in the range of 60%   to 65%. Left ventricular diastolic function parameters were   normal. - Left atrium: The atrium was mildly dilated. - Atrial septum: No defect or patent foramen ovale was identified. - Impressions: Normal GLS -20.7.   Subjective: Patient was seen and examined at bedside this AM and his BP was better controlled. Denied any Cp, N/V, Abdominal Pain, lightheadedness or dizziness. No other complaints or concerns at this time.    Discharge Exam: Vitals:   03/27/16 2130 03/28/16 0500  BP: 130/82 (!) 143/92  Pulse: (!) 56 (!) 51  Resp: 16 17  Temp: 98 F (36.7 C) 97.8 F (36.6 C)   Vitals:   03/27/16 1000 03/27/16 1519 03/27/16 2130 03/28/16 0500  BP: (!) 164/100 (!) 131/104 130/82 (!) 143/92  Pulse: (!) 57 60 (!) 56 (!) 51  Resp: 12  16 17   Temp:  97.8 F (36.6 C) 98 F (36.7 C) 97.8 F (36.6 C)  TempSrc:  Oral Oral   SpO2: 93% 98% 99% 96%  Weight:    116.2 kg (256 lb 1.6 oz)  Height:        General: Pt is alert, awake, not in acute distress Cardiovascular: RRR, S1/S2 +, no rubs, no gallops Respiratory: CTA bilaterally, no wheezing, no rhonchi Abdominal: Soft, NT, ND, bowel sounds + Extremities: no edema, no cyanosis Neurological: No focal deficits.  Psych: A and O x3.    The results of significant diagnostics from this hospitalization (including imaging, microbiology, ancillary and laboratory) are listed below for reference.     Microbiology: Recent  Results (from the past 240 hour(s))  MRSA PCR Screening     Status: None   Collection Time: 03/21/16 12:09 AM  Result Value Ref Range Status   MRSA by PCR NEGATIVE NEGATIVE Final    Comment:        The GeneXpert MRSA Assay (FDA approved for NASAL specimens only), is one component of a comprehensive MRSA colonization surveillance program. It is not intended to diagnose MRSA infection nor to guide or monitor treatment for MRSA  infections.      Labs: BNP (last 3 results)  Recent Labs  03/20/16 1910  BNP 21.7   Basic Metabolic Panel:  Recent Labs Lab 03/25/16 1025  NA 135  K 4.0  CL 98*  CO2 29  GLUCOSE 178*  BUN 11  CREATININE 1.14  CALCIUM 9.7   Liver Function Tests:  Recent Labs Lab 03/25/16 1025  AST 18  ALT 18  ALKPHOS 73  BILITOT 0.4  PROT 7.3  ALBUMIN 4.0   No results for input(s): LIPASE, AMYLASE in the last 168 hours. No results for input(s): AMMONIA in the last 168 hours. CBC:  Recent Labs Lab 03/25/16 1025  WBC 6.1  NEUTROABS 4.5  HGB 15.4  HCT 45.5  MCV 84.9  PLT 152   Cardiac Enzymes:  Recent Labs Lab 03/25/16 1149  TROPONINI <0.03   BNP: Invalid input(s): POCBNP CBG:  Recent Labs Lab 03/27/16 1127 03/27/16 1616 03/27/16 2129 03/28/16 0748 03/28/16 1129  GLUCAP 116* 172* 171* 122* 106*   D-Dimer No results for input(s): DDIMER in the last 72 hours. Hgb A1c No results for input(s): HGBA1C in the last 72 hours. Lipid Profile No results for input(s): CHOL, HDL, LDLCALC, TRIG, CHOLHDL, LDLDIRECT in the last 72 hours. Thyroid function studies No results for input(s): TSH, T4TOTAL, T3FREE, THYROIDAB in the last 72 hours.  Invalid input(s): FREET3 Anemia work up No results for input(s): VITAMINB12, FOLATE, FERRITIN, TIBC, IRON, RETICCTPCT in the last 72 hours. Urinalysis    Component Value Date/Time   COLORURINE YELLOW 11/08/2015 2331   APPEARANCEUR CLOUDY (A) 11/08/2015 2331   LABSPEC 1.035 (H) 11/08/2015 2331    PHURINE 5.5 11/08/2015 2331   GLUCOSEU >1000 (A) 11/08/2015 2331   HGBUR NEGATIVE 11/08/2015 2331   BILIRUBINUR NEGATIVE 11/08/2015 2331   KETONESUR 15 (A) 11/08/2015 2331   PROTEINUR 30 (A) 11/08/2015 2331   NITRITE NEGATIVE 11/08/2015 2331   LEUKOCYTESUR NEGATIVE 11/08/2015 2331   Sepsis Labs Invalid input(s): PROCALCITONIN,  WBC,  LACTICIDVEN Microbiology Recent Results (from the past 240 hour(s))  MRSA PCR Screening     Status: None   Collection Time: 03/21/16 12:09 AM  Result Value Ref Range Status   MRSA by PCR NEGATIVE NEGATIVE Final    Comment:        The GeneXpert MRSA Assay (FDA approved for NASAL specimens only), is one component of a comprehensive MRSA colonization surveillance program. It is not intended to diagnose MRSA infection nor to guide or monitor treatment for MRSA infections.    Time coordinating discharge: Over 30 minutes  SIGNED:   Merlene Laughter, DO [facility] 03/28/2016, 12:04 PM Pager [phone]  If 7PM-7AM, please contact night-coverage [email or web address] Password [email or web address]

## 2016-03-29 LAB — ALDOSTERONE + RENIN ACTIVITY W/ RATIO: Aldosterone: 10.2 ng/dL (ref 0.0–30.0)

## 2017-04-13 IMAGING — DX DG CHEST 2V
2 series · 2 of 2 positions shown · non-contrast
Comparison: None.

CLINICAL DATA: Chest pain

EXAM:
CHEST  2 VIEW

[chest pa]
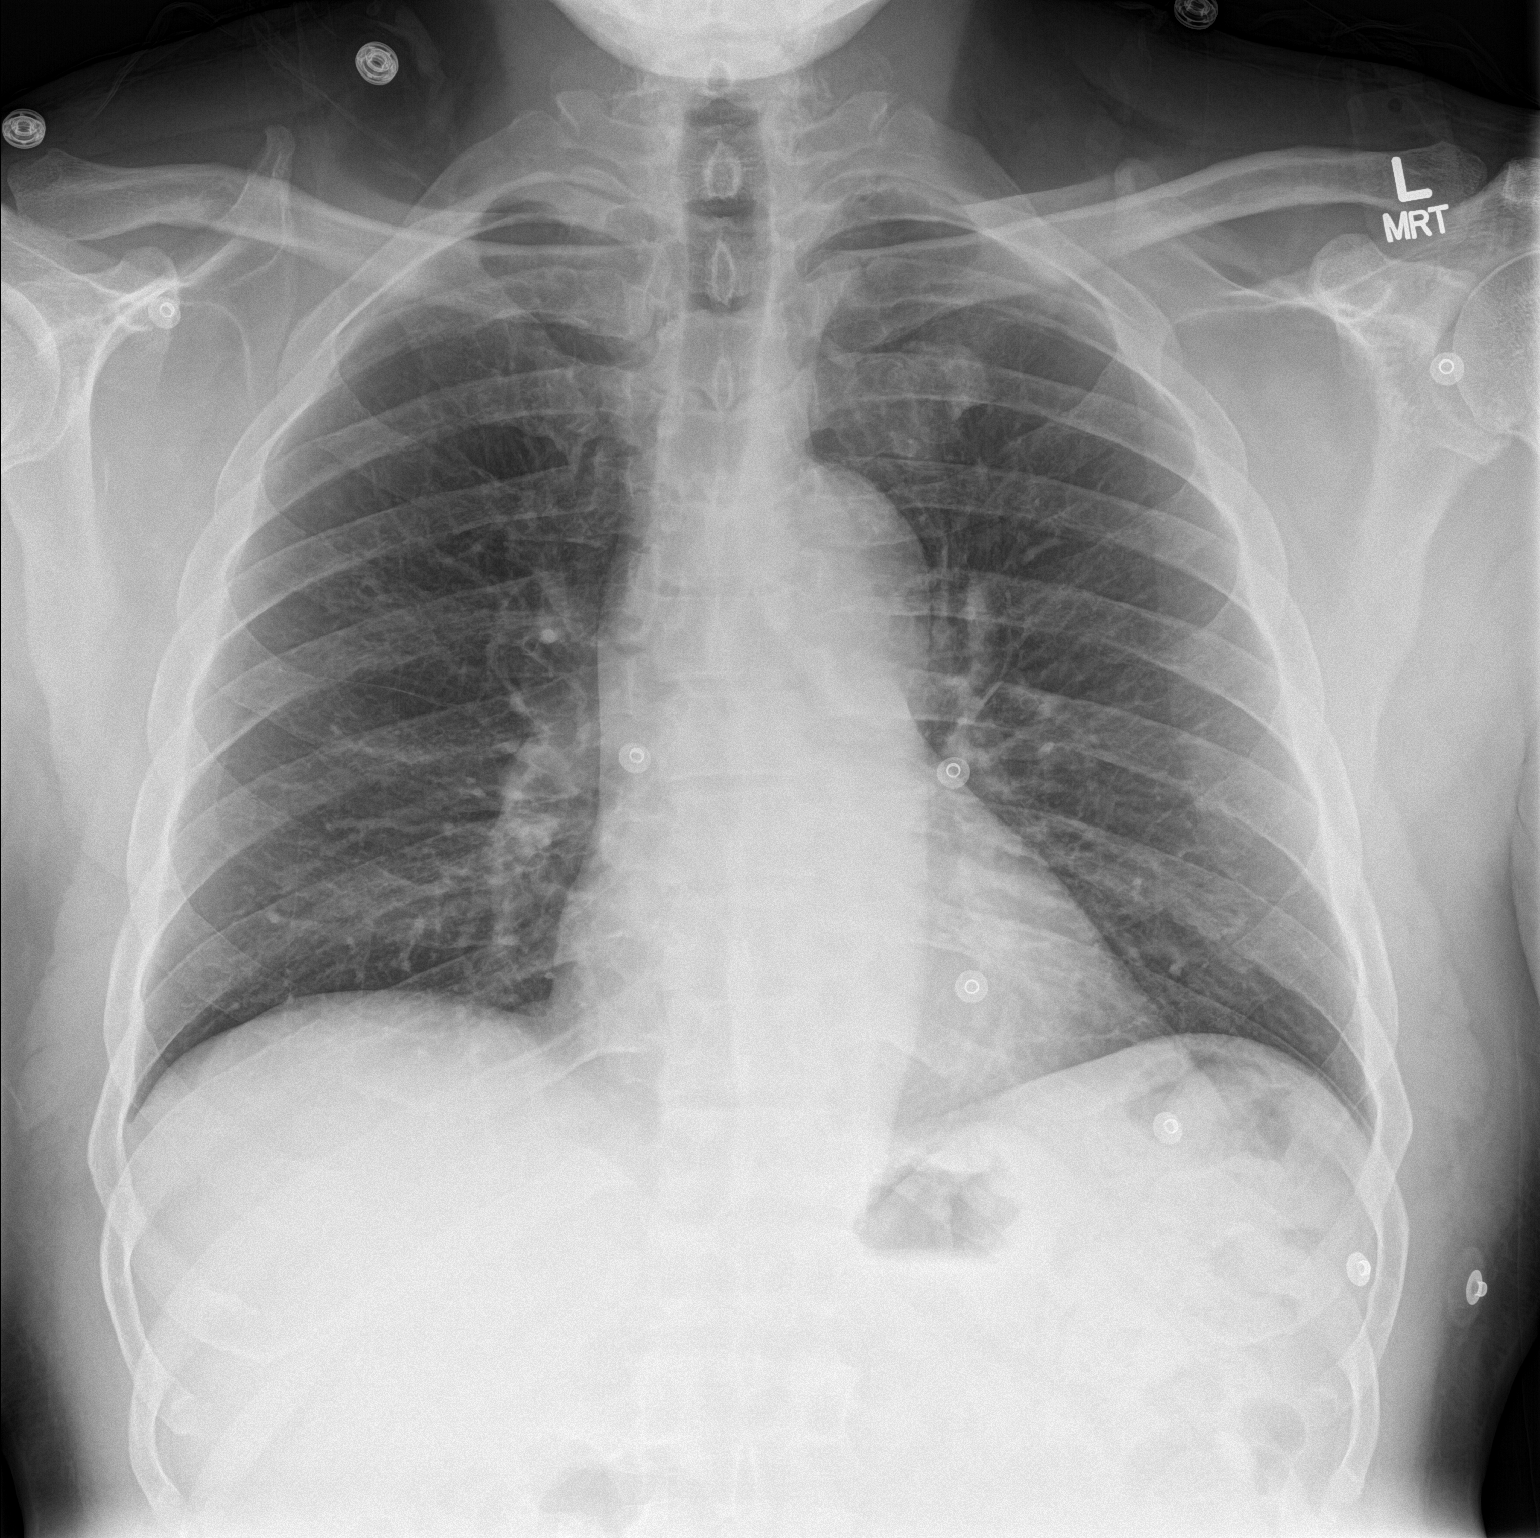

[chest lat]
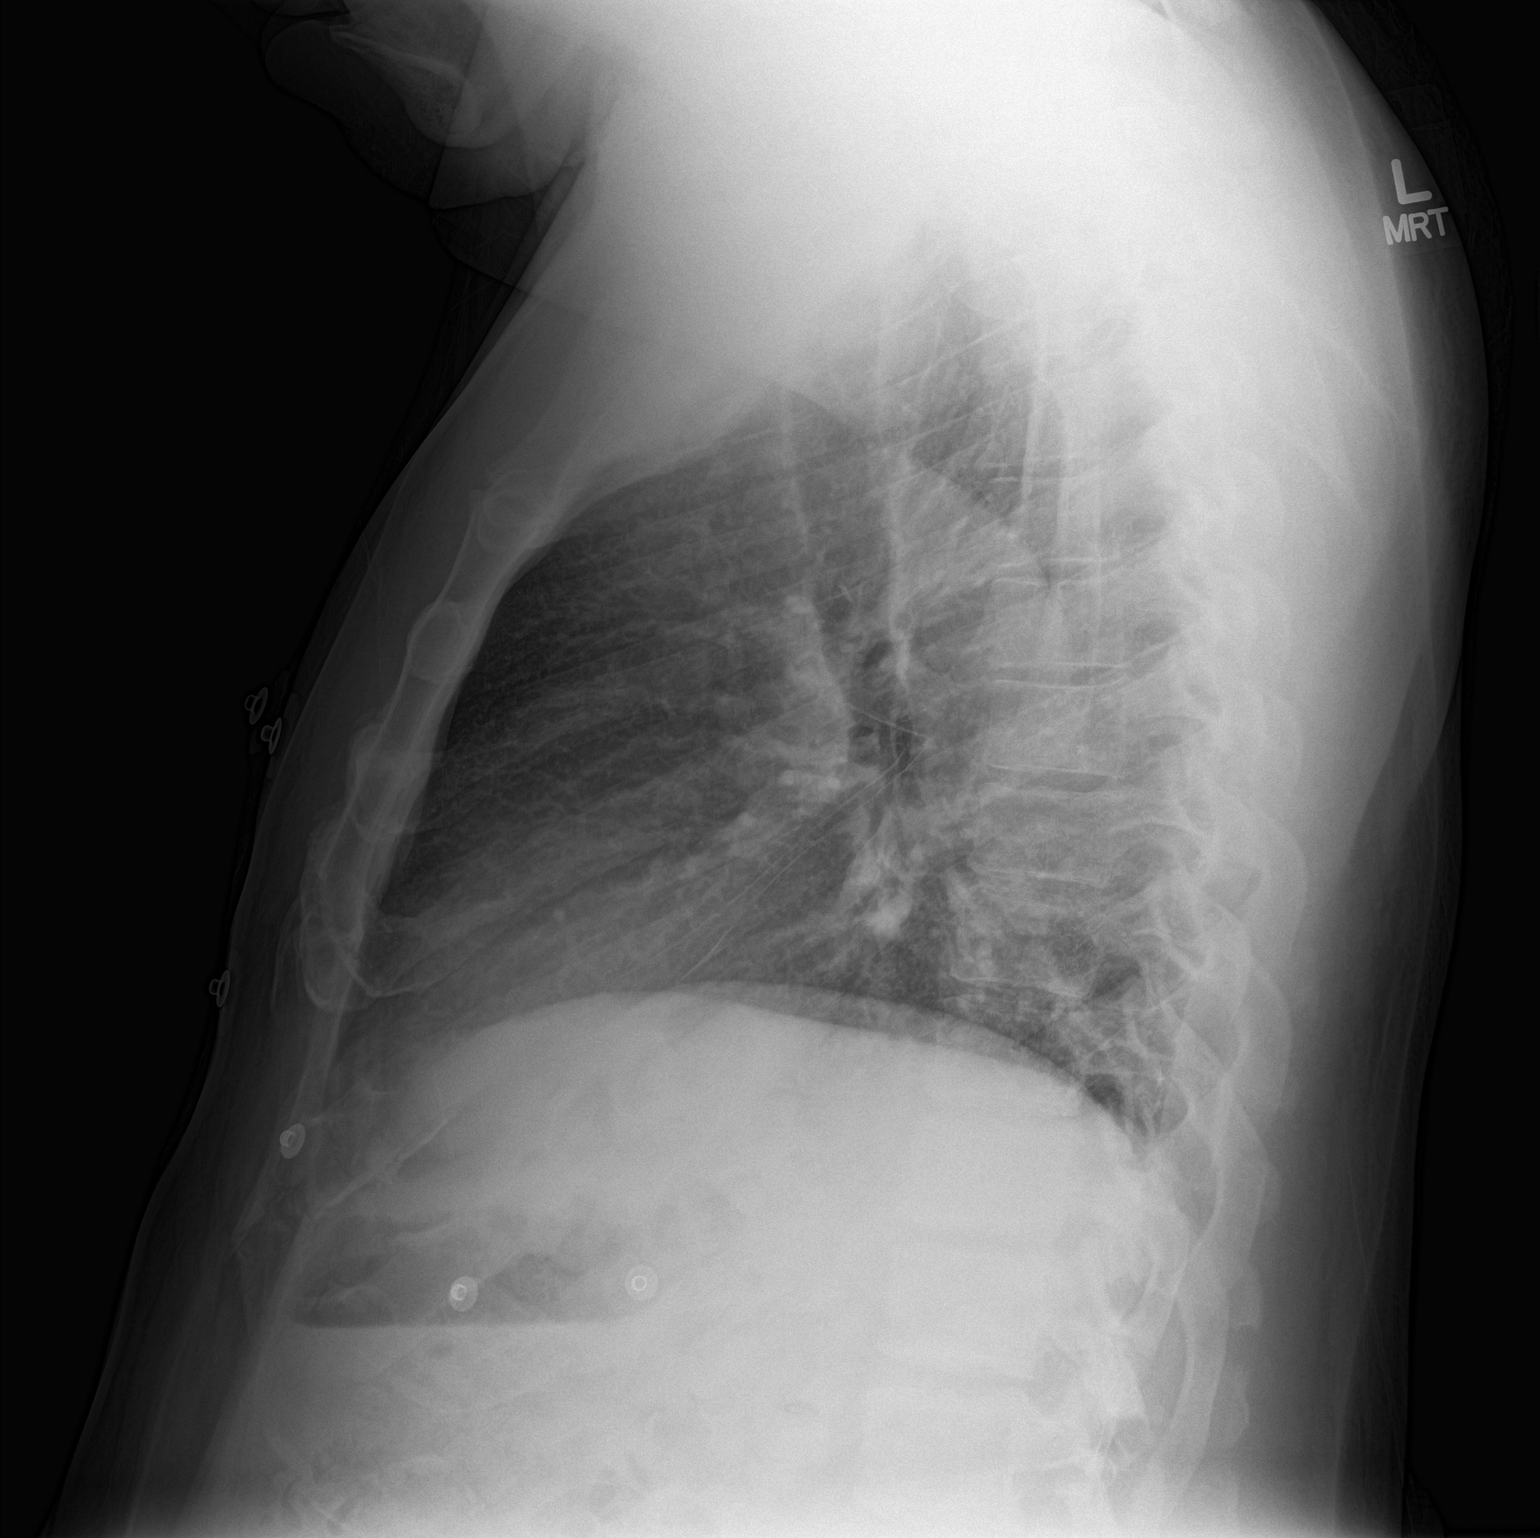

[2 of 2 positions shown; findings below may reference images not displayed]

FINDINGS: Normal heart size. Normal mediastinal contour. No pneumothorax. No
pleural effusion. Lungs appear clear, with no acute consolidative
airspace disease and no pulmonary edema.
IMPRESSION: No active cardiopulmonary disease.

## 2022-08-17 DEATH — deceased
